# Patient Record
Sex: Female | Born: 1935 | Race: White | Hispanic: No | Smoking: Never smoker
Health system: Southern US, Community
[De-identification: ages and names within clinical notes are randomized; demographics above are authoritative.]

## PROBLEM LIST (undated history)

## (undated) DIAGNOSIS — I5033 Acute on chronic diastolic (congestive) heart failure: Secondary | ICD-10-CM

## (undated) DIAGNOSIS — I10 Essential (primary) hypertension: Secondary | ICD-10-CM

## (undated) DIAGNOSIS — I4891 Unspecified atrial fibrillation: Secondary | ICD-10-CM

## (undated) DIAGNOSIS — C50919 Malignant neoplasm of unspecified site of unspecified female breast: Secondary | ICD-10-CM

## (undated) DIAGNOSIS — E039 Hypothyroidism, unspecified: Secondary | ICD-10-CM

## (undated) DIAGNOSIS — I38 Endocarditis, valve unspecified: Secondary | ICD-10-CM

## (undated) HISTORY — DX: Unspecified atrial fibrillation: I48.91

## (undated) HISTORY — PX: MASTECTOMY: SHX3

## (undated) HISTORY — PX: OTHER SURGICAL HISTORY: SHX169

## (undated) HISTORY — DX: Acute on chronic diastolic (congestive) heart failure: I50.33

## (undated) HISTORY — DX: Endocarditis, valve unspecified: I38

## (undated) HISTORY — DX: Essential (primary) hypertension: I10

## (undated) HISTORY — DX: Hypothyroidism, unspecified: E03.9

## (undated) HISTORY — DX: Malignant neoplasm of unspecified site of unspecified female breast: C50.919

## (undated) HISTORY — PX: CHOLECYSTECTOMY: SHX55

---

## 1997-07-01 ENCOUNTER — Other Ambulatory Visit: Admission: RE | Admit: 1997-07-01 | Discharge: 1997-07-01 | Payer: Self-pay | Admitting: Cardiology

## 1997-09-08 ENCOUNTER — Other Ambulatory Visit: Admission: RE | Admit: 1997-09-08 | Discharge: 1997-09-08 | Payer: Self-pay | Admitting: Cardiology

## 1998-03-03 ENCOUNTER — Other Ambulatory Visit: Admission: RE | Admit: 1998-03-03 | Discharge: 1998-03-03 | Payer: Self-pay | Admitting: Cardiology

## 1999-05-26 ENCOUNTER — Emergency Department (HOSPITAL_COMMUNITY): Admission: EM | Admit: 1999-05-26 | Discharge: 1999-05-26 | Payer: Self-pay | Admitting: Emergency Medicine

## 1999-05-26 ENCOUNTER — Encounter: Payer: Self-pay | Admitting: Emergency Medicine

## 2000-03-19 ENCOUNTER — Ambulatory Visit (HOSPITAL_BASED_OUTPATIENT_CLINIC_OR_DEPARTMENT_OTHER): Admission: RE | Admit: 2000-03-19 | Discharge: 2000-03-19 | Payer: Self-pay | Admitting: Plastic Surgery

## 2000-03-19 ENCOUNTER — Encounter (INDEPENDENT_AMBULATORY_CARE_PROVIDER_SITE_OTHER): Payer: Self-pay | Admitting: Specialist

## 2000-07-03 ENCOUNTER — Other Ambulatory Visit: Admission: RE | Admit: 2000-07-03 | Discharge: 2000-07-03 | Payer: Self-pay | Admitting: Cardiology

## 2005-10-15 ENCOUNTER — Emergency Department (HOSPITAL_COMMUNITY): Admission: EM | Admit: 2005-10-15 | Discharge: 2005-10-15 | Payer: Self-pay | Admitting: Emergency Medicine

## 2008-03-27 ENCOUNTER — Inpatient Hospital Stay (HOSPITAL_COMMUNITY): Admission: AD | Admit: 2008-03-27 | Discharge: 2008-04-03 | Payer: Self-pay | Admitting: Cardiology

## 2008-04-02 ENCOUNTER — Encounter: Payer: Self-pay | Admitting: Cardiology

## 2008-08-07 ENCOUNTER — Ambulatory Visit (HOSPITAL_COMMUNITY): Admission: RE | Admit: 2008-08-07 | Discharge: 2008-08-07 | Payer: Self-pay | Admitting: Cardiology

## 2008-10-05 ENCOUNTER — Emergency Department (HOSPITAL_COMMUNITY): Admission: EM | Admit: 2008-10-05 | Discharge: 2008-10-05 | Payer: Self-pay | Admitting: Emergency Medicine

## 2008-10-18 ENCOUNTER — Ambulatory Visit: Payer: Self-pay | Admitting: Internal Medicine

## 2008-10-19 ENCOUNTER — Inpatient Hospital Stay (HOSPITAL_COMMUNITY): Admission: EM | Admit: 2008-10-19 | Discharge: 2008-10-21 | Payer: Self-pay | Admitting: Emergency Medicine

## 2008-10-26 ENCOUNTER — Ambulatory Visit: Payer: Self-pay | Admitting: Internal Medicine

## 2008-10-27 ENCOUNTER — Inpatient Hospital Stay (HOSPITAL_COMMUNITY): Admission: EM | Admit: 2008-10-27 | Discharge: 2008-10-30 | Payer: Self-pay | Admitting: Emergency Medicine

## 2009-07-18 ENCOUNTER — Emergency Department (HOSPITAL_COMMUNITY): Admission: EM | Admit: 2009-07-18 | Discharge: 2009-07-18 | Payer: Self-pay | Admitting: Podiatry

## 2009-10-15 ENCOUNTER — Ambulatory Visit: Payer: Self-pay | Admitting: Cardiology

## 2009-10-29 ENCOUNTER — Ambulatory Visit: Payer: Self-pay | Admitting: Cardiology

## 2009-11-08 ENCOUNTER — Ambulatory Visit: Payer: Self-pay | Admitting: Cardiology

## 2009-11-18 ENCOUNTER — Ambulatory Visit: Payer: Self-pay | Admitting: Cardiology

## 2009-12-09 ENCOUNTER — Ambulatory Visit: Payer: Self-pay | Admitting: Cardiology

## 2010-01-07 ENCOUNTER — Ambulatory Visit: Payer: Self-pay | Admitting: Cardiovascular Disease

## 2010-02-04 ENCOUNTER — Ambulatory Visit: Payer: Self-pay | Admitting: Cardiovascular Disease

## 2010-02-07 ENCOUNTER — Ambulatory Visit: Payer: Self-pay | Admitting: Cardiology

## 2010-02-10 ENCOUNTER — Ambulatory Visit: Payer: Self-pay | Admitting: Cardiology

## 2010-02-14 ENCOUNTER — Inpatient Hospital Stay (HOSPITAL_COMMUNITY)
Admission: EM | Admit: 2010-02-14 | Discharge: 2010-02-17 | Payer: Self-pay | Source: Home / Self Care | Attending: Cardiology | Admitting: Cardiology

## 2010-03-30 ENCOUNTER — Ambulatory Visit: Payer: Self-pay | Admitting: Cardiology

## 2010-04-06 ENCOUNTER — Ambulatory Visit: Payer: Self-pay | Admitting: Cardiology

## 2010-04-12 ENCOUNTER — Ambulatory Visit: Payer: Self-pay | Admitting: Cardiology

## 2010-04-16 ENCOUNTER — Encounter: Payer: Self-pay | Admitting: Cardiology

## 2010-04-16 DIAGNOSIS — I10 Essential (primary) hypertension: Secondary | ICD-10-CM | POA: Insufficient documentation

## 2010-04-16 DIAGNOSIS — I4891 Unspecified atrial fibrillation: Secondary | ICD-10-CM | POA: Insufficient documentation

## 2010-04-16 DIAGNOSIS — E039 Hypothyroidism, unspecified: Secondary | ICD-10-CM

## 2010-04-27 ENCOUNTER — Encounter (INDEPENDENT_AMBULATORY_CARE_PROVIDER_SITE_OTHER): Payer: Medicare Other

## 2010-04-27 DIAGNOSIS — Z7901 Long term (current) use of anticoagulants: Secondary | ICD-10-CM

## 2010-04-27 DIAGNOSIS — I4891 Unspecified atrial fibrillation: Secondary | ICD-10-CM

## 2010-05-11 ENCOUNTER — Encounter (INDEPENDENT_AMBULATORY_CARE_PROVIDER_SITE_OTHER): Payer: Medicare Other

## 2010-05-11 DIAGNOSIS — I4891 Unspecified atrial fibrillation: Secondary | ICD-10-CM

## 2010-05-11 DIAGNOSIS — Z7901 Long term (current) use of anticoagulants: Secondary | ICD-10-CM

## 2010-05-23 LAB — CBC
MCH: 31.8 pg (ref 26.0–34.0)
MCHC: 32.8 g/dL (ref 30.0–36.0)
MCHC: 33.4 g/dL (ref 30.0–36.0)
Platelets: 270 10*3/uL (ref 150–400)
Platelets: 284 10*3/uL (ref 150–400)
RDW: 14.8 % (ref 11.5–15.5)
RDW: 15 % (ref 11.5–15.5)

## 2010-05-23 LAB — CARDIAC PANEL(CRET KIN+CKTOT+MB+TROPI)
CK, MB: 5.1 ng/mL — ABNORMAL HIGH (ref 0.3–4.0)
Relative Index: 3.4 — ABNORMAL HIGH (ref 0.0–2.5)
Total CK: 152 U/L (ref 7–177)
Troponin I: 0.03 ng/mL (ref 0.00–0.06)

## 2010-05-23 LAB — DIFFERENTIAL
Basophils Absolute: 0 10*3/uL (ref 0.0–0.1)
Basophils Relative: 0 % (ref 0–1)
Neutro Abs: 8.1 10*3/uL — ABNORMAL HIGH (ref 1.7–7.7)
Neutrophils Relative %: 83 % — ABNORMAL HIGH (ref 43–77)

## 2010-05-23 LAB — POCT I-STAT, CHEM 8
BUN: 26 mg/dL — ABNORMAL HIGH (ref 6–23)
Calcium, Ion: 1.14 mmol/L (ref 1.12–1.32)
HCT: 43 % (ref 36.0–46.0)
TCO2: 26 mmol/L (ref 0–100)

## 2010-05-23 LAB — BASIC METABOLIC PANEL
BUN: 20 mg/dL (ref 6–23)
BUN: 23 mg/dL (ref 6–23)
CO2: 26 mEq/L (ref 19–32)
CO2: 27 mEq/L (ref 19–32)
Calcium: 8.3 mg/dL — ABNORMAL LOW (ref 8.4–10.5)
Calcium: 8.3 mg/dL — ABNORMAL LOW (ref 8.4–10.5)
Chloride: 109 mEq/L (ref 96–112)
Chloride: 110 mEq/L (ref 96–112)
Creatinine, Ser: 0.97 mg/dL (ref 0.4–1.2)
Creatinine, Ser: 1 mg/dL (ref 0.4–1.2)
GFR calc Af Amer: 52 mL/min — ABNORMAL LOW (ref >60)
GFR calc non Af Amer: 43 mL/min — ABNORMAL LOW (ref >60)
GFR calc non Af Amer: 46 mL/min — ABNORMAL LOW (ref >60)
Glucose, Bld: 115 mg/dL — ABNORMAL HIGH (ref 70–99)
Glucose, Bld: 127 mg/dL — ABNORMAL HIGH (ref 70–99)
Glucose, Bld: 141 mg/dL — ABNORMAL HIGH (ref 70–99)
Glucose, Bld: 178 mg/dL — ABNORMAL HIGH (ref 70–99)
Potassium: 3.2 mEq/L — ABNORMAL LOW (ref 3.5–5.1)
Sodium: 141 mEq/L (ref 135–145)

## 2010-05-23 LAB — CULTURE, BLOOD (ROUTINE X 2)
Culture  Setup Time: 201112060019
Culture: NO GROWTH
Culture: NO GROWTH

## 2010-05-23 LAB — URINALYSIS, ROUTINE W REFLEX MICROSCOPIC
Ketones, ur: NEGATIVE mg/dL
Nitrite: NEGATIVE
Protein, ur: NEGATIVE mg/dL
pH: 6 (ref 5.0–8.0)

## 2010-05-23 LAB — PROTIME-INR
INR: 1.74 — ABNORMAL HIGH (ref 0.00–1.49)
INR: 1.91 — ABNORMAL HIGH (ref 0.00–1.49)
INR: 2.33 — ABNORMAL HIGH (ref 0.00–1.49)
Prothrombin Time: 20.5 seconds — ABNORMAL HIGH (ref 11.6–15.2)
Prothrombin Time: 22 seconds — ABNORMAL HIGH (ref 11.6–15.2)

## 2010-05-23 LAB — BRAIN NATRIURETIC PEPTIDE
Pro B Natriuretic peptide (BNP): 512 pg/mL — ABNORMAL HIGH (ref 0.0–100.0)
Pro B Natriuretic peptide (BNP): 532 pg/mL — ABNORMAL HIGH (ref 0.0–100.0)

## 2010-05-23 LAB — CK TOTAL AND CKMB (NOT AT ARMC): Relative Index: 3.8 — ABNORMAL HIGH (ref 0.0–2.5)

## 2010-05-23 LAB — T4, FREE: Free T4: 1.33 ng/dL (ref 0.80–1.80)

## 2010-05-23 LAB — D-DIMER, QUANTITATIVE: D-Dimer, Quant: 1.54 ug/mL-FEU — ABNORMAL HIGH (ref 0.00–0.48)

## 2010-05-23 LAB — POCT CARDIAC MARKERS

## 2010-05-25 ENCOUNTER — Encounter (INDEPENDENT_AMBULATORY_CARE_PROVIDER_SITE_OTHER): Payer: Medicare Other

## 2010-05-25 DIAGNOSIS — Z7901 Long term (current) use of anticoagulants: Secondary | ICD-10-CM

## 2010-05-25 DIAGNOSIS — I4891 Unspecified atrial fibrillation: Secondary | ICD-10-CM

## 2010-06-06 ENCOUNTER — Encounter: Payer: Self-pay | Admitting: Cardiology

## 2010-06-06 ENCOUNTER — Ambulatory Visit (INDEPENDENT_AMBULATORY_CARE_PROVIDER_SITE_OTHER): Payer: Medicare Other | Admitting: Cardiology

## 2010-06-06 VITALS — BP 120/60 | HR 76 | Wt 107.0 lb

## 2010-06-06 DIAGNOSIS — I4891 Unspecified atrial fibrillation: Secondary | ICD-10-CM

## 2010-06-06 DIAGNOSIS — Z7901 Long term (current) use of anticoagulants: Secondary | ICD-10-CM

## 2010-06-06 DIAGNOSIS — I10 Essential (primary) hypertension: Secondary | ICD-10-CM

## 2010-06-06 NOTE — Assessment & Plan Note (Signed)
Blood pressure is remaining stable and her present medication.  No change in regimen.

## 2010-06-06 NOTE — Assessment & Plan Note (Signed)
The patient feels well.  She remains in atrial fibrillation with a controlled ventricular response.  Her INR is therapeutic at 3.0.  She did not eat as much last week because she was sick with bronchitis and a bad cold.  This would account for the fact that her INR is at the high end of the range today.  Continue same medication.

## 2010-06-06 NOTE — Progress Notes (Signed)
History of Present Illness: This pleasant 77 year old Caucasian female is seen back for a scheduled followup visit.  She has a history of established atrial fibrillation with a controlled ventricular response.  She is on long-term Coumadin.  She's had no new cardiac symptoms.  She did have a recent bad bout of bronchitis.  She has lost 3 pounds which she attributes to her recent illness.  Current Outpatient Prescriptions  Medication Sig Dispense Refill  . aspirin 81 MG tablet Take 81 mg by mouth daily.        . chlordiazePOXIDE (LIBRIUM) 5 MG capsule Take 5 mg by mouth daily as needed. For nerves       . digoxin (LANOXIN) 0.125 MG tablet Take 125 mcg by mouth daily.        Marland Kitchen diltiazem (CARDIZEM CD) 300 MG 24 hr capsule Take 300 mg by mouth daily.        . furosemide (LASIX) 40 MG tablet Take 40 mg by mouth daily.        . isosorbide mononitrate (IMDUR) 60 MG 24 hr tablet Take 60 mg by mouth daily.        Marland Kitchen levothyroxine (SYNTHROID, LEVOTHROID) 100 MCG tablet Take 100 mcg by mouth daily.        Marland Kitchen METOPROLOL TARTRATE PO Take by mouth. 100 mg  One half bid      . nitroGLYCERIN (NITROSTAT) 0.4 MG SL tablet Place 0.4 mg under the tongue every 5 (five) minutes as needed. For chest pains       . potassium chloride SA (K-DUR,KLOR-CON) 20 MEQ tablet Take 20 mEq by mouth every other day.        . warfarin (COUMADIN) 5 MG tablet Take 5 mg by mouth as directed. Taking 5mg  one day and 2.5 6 days or as directed        Allergies  Allergen Reactions  . Penicillins   . Pepcid   . Sulfa Antibiotics   . Tetracyclines & Related     Patient Active Problem List  Diagnoses  . Atrial fibrillation with controlled ventricular response  . Hypothyroidism  . Hypertension    History  Smoking status  . Never Smoker   Smokeless tobacco  . Not on file    History  Alcohol Use     No family history on file.  Review of Systems: Constitutional: no fever chills diaphoresis or fatigue or change in weight.    Head and neck: no hearing loss, no epistaxis, no photophobia or visual disturbance. Respiratory: No cough, shortness of breath or wheezing. Cardiovascular: No chest pain peripheral edema, palpitations. Gastrointestinal: No abdominal distention, no abdominal pain, no change in bowel habits hematochezia or melena. Genitourinary: No dysuria, no frequency, no urgency, no nocturia. Musculoskeletal:No arthralgias, no back pain, no gait disturbance or myalgias. Neurological: No dizziness, no headaches, no numbness, no seizures, no syncope, no weakness, no tremors. Hematologic: No lymphadenopathy, no easy bruising. Psychiatric: No confusion, no hallucinations, no sleep disturbance.    Physical Exam: Filed Vitals:   06/06/10 1404  BP: 120/60  Pulse: 76  Weight: 107 lb (48.535 kg)  This is a pleasant elderly woman in no distress.Pupils equal and reactive.   Extraocular Movements are full.  There is no scleral icterus.  The mouth and pharynx are normal.  The neck is supple.  The carotids reveal no bruits.  The jugular venous pressure is normal.  The thyroid is not enlarged.  There is no lymphadenopathy.The chest is clear to percussion and  auscultation. There are no rales or rhonchi. Expansion of the chest is symmetrical.  She's had a previous right mastectomy.The precordium is quiet.  The first heart sound is normal.  The second heart sound is physiologically split.  There is no murmur gallop rub or click.  There is no abnormal lift or heave.  The rhythm is irregular.The abdomen is soft and nontender. Bowel sounds are normal. The liver and spleen are not enlarged. There Are no abdominal masses. There are no bruits.The pedal pulses are good.  There is no phlebitis or edema.  There is no cyanosis or clubbing.  She does have chronic lymphedema of the right arm secondary to mastectomy.Strength is normal and symmetrical in all extremities.  There is no lateralizing weakness.  There are no sensory deficits.The  skin is warm and dry.  There is no rash.   Assessment / Plan:

## 2010-06-14 ENCOUNTER — Other Ambulatory Visit: Payer: Self-pay | Admitting: *Deleted

## 2010-06-14 DIAGNOSIS — E039 Hypothyroidism, unspecified: Secondary | ICD-10-CM

## 2010-06-14 MED ORDER — LEVOTHYROXINE SODIUM 100 MCG PO TABS: 100.0000 ug | ORAL_TABLET | Freq: Every day | ORAL | Status: DC

## 2010-06-14 NOTE — Telephone Encounter (Signed)
Refilled meds per fax request.  

## 2010-06-18 LAB — BASIC METABOLIC PANEL
BUN: 25 mg/dL — ABNORMAL HIGH (ref 6–23)
BUN: 20 mg/dL (ref 6–23)
BUN: 26 mg/dL — ABNORMAL HIGH (ref 6–23)
CO2: 23 mEq/L (ref 19–32)
CO2: 27 mEq/L (ref 19–32)
Calcium: 9.1 mg/dL (ref 8.4–10.5)
Calcium: 8.4 mg/dL (ref 8.4–10.5)
Chloride: 102 mEq/L (ref 96–112)
Chloride: 105 mEq/L (ref 96–112)
Chloride: 106 mEq/L (ref 96–112)
Chloride: 107 mEq/L (ref 96–112)
Creatinine, Ser: 1.1 mg/dL (ref 0.4–1.2)
Creatinine, Ser: 1.45 mg/dL — ABNORMAL HIGH (ref 0.4–1.2)
Creatinine, Ser: 1.85 mg/dL — ABNORMAL HIGH (ref 0.4–1.2)
GFR calc Af Amer: 56 mL/min — ABNORMAL LOW (ref >60)
GFR calc Af Amer: 31 mL/min — ABNORMAL LOW (ref >60)
GFR calc non Af Amer: 47 mL/min — ABNORMAL LOW (ref >60)
GFR calc non Af Amer: 26 mL/min — ABNORMAL LOW (ref >60)
GFR calc non Af Amer: 34 mL/min — ABNORMAL LOW (ref >60)
GFR calc non Af Amer: 55 mL/min — ABNORMAL LOW (ref >60)
Glucose, Bld: 160 mg/dL — ABNORMAL HIGH (ref 70–99)
Glucose, Bld: 98 mg/dL (ref 70–99)
Potassium: 4.4 mEq/L (ref 3.5–5.1)
Potassium: 4.8 mEq/L (ref 3.5–5.1)
Sodium: 133 mEq/L — ABNORMAL LOW (ref 135–145)
Sodium: 138 mEq/L (ref 135–145)

## 2010-06-18 LAB — CARDIAC PANEL(CRET KIN+CKTOT+MB+TROPI)
CK, MB: 1.6 ng/mL (ref 0.3–4.0)
CK, MB: 2.2 ng/mL (ref 0.3–4.0)
Total CK: 44 U/L (ref 7–177)
Troponin I: 0.03 ng/mL (ref 0.00–0.06)

## 2010-06-18 LAB — COMPREHENSIVE METABOLIC PANEL
ALT: 45 U/L — ABNORMAL HIGH (ref 0–35)
ALT: 59 U/L — ABNORMAL HIGH (ref 0–35)
AST: 27 U/L (ref 0–37)
AST: 41 U/L — ABNORMAL HIGH (ref 0–37)
Alkaline Phosphatase: 104 U/L (ref 39–117)
BUN: 15 mg/dL (ref 6–23)
CO2: 26 mEq/L (ref 19–32)
CO2: 28 mEq/L (ref 19–32)
CO2: 29 mEq/L (ref 19–32)
Calcium: 8.2 mg/dL — ABNORMAL LOW (ref 8.4–10.5)
Calcium: 8.5 mg/dL (ref 8.4–10.5)
Chloride: 101 mEq/L (ref 96–112)
Chloride: 106 mEq/L (ref 96–112)
Creatinine, Ser: 0.84 mg/dL (ref 0.4–1.2)
Creatinine, Ser: 0.85 mg/dL (ref 0.4–1.2)
GFR calc Af Amer: >60 mL/min (ref >60)
GFR calc non Af Amer: 50 mL/min — ABNORMAL LOW (ref >60)
GFR calc non Af Amer: >60 mL/min (ref >60)
GFR calc non Af Amer: >60 mL/min (ref >60)
Glucose, Bld: 116 mg/dL — ABNORMAL HIGH (ref 70–99)
Glucose, Bld: 90 mg/dL (ref 70–99)
Potassium: 5 mEq/L (ref 3.5–5.1)
Sodium: 138 mEq/L (ref 135–145)
Total Bilirubin: 0.9 mg/dL (ref 0.3–1.2)
Total Bilirubin: 0.9 mg/dL (ref 0.3–1.2)

## 2010-06-18 LAB — POCT CARDIAC MARKERS
CKMB, poc: 1.3 ng/mL (ref 1.0–8.0)
Myoglobin, poc: 102 ng/mL (ref 12–200)
Myoglobin, poc: 84.7 ng/mL (ref 12–200)
Troponin i, poc: <0.05 ng/mL (ref 0.00–0.09)
Troponin i, poc: <0.05 ng/mL (ref 0.00–0.09)

## 2010-06-18 LAB — LIPID PANEL
Cholesterol: 124 mg/dL (ref 0–200)
LDL Cholesterol: 59 mg/dL (ref 0–99)
Total CHOL/HDL Ratio: 2.4 RATIO

## 2010-06-18 LAB — TROPONIN I: Troponin I: 0.04 ng/mL (ref 0.00–0.06)

## 2010-06-18 LAB — CK TOTAL AND CKMB (NOT AT ARMC): Relative Index: INVALID (ref 0.0–2.5)

## 2010-06-18 LAB — PROTIME-INR
INR: 2 — ABNORMAL HIGH (ref 0.00–1.49)
INR: 2.8 — ABNORMAL HIGH (ref 0.00–1.49)
INR: 2.5 — ABNORMAL HIGH (ref 0.00–1.49)
Prothrombin Time: 29.1 seconds — ABNORMAL HIGH (ref 11.6–15.2)
Prothrombin Time: 29.4 seconds — ABNORMAL HIGH (ref 11.6–15.2)
Prothrombin Time: 36.8 seconds — ABNORMAL HIGH (ref 11.6–15.2)

## 2010-06-18 LAB — CBC
HCT: 41.6 % (ref 36.0–46.0)
HCT: 40.6 % (ref 36.0–46.0)
HCT: 41.7 % (ref 36.0–46.0)
Hemoglobin: 13.6 g/dL (ref 12.0–15.0)
Hemoglobin: 14.1 g/dL (ref 12.0–15.0)
Hemoglobin: 13.8 g/dL (ref 12.0–15.0)
Hemoglobin: 14 g/dL (ref 12.0–15.0)
MCHC: 33.7 g/dL (ref 30.0–36.0)
MCHC: 34 g/dL (ref 30.0–36.0)
MCHC: 34.6 g/dL (ref 30.0–36.0)
MCV: 94.3 fL (ref 78.0–100.0)
MCV: 94.6 fL (ref 78.0–100.0)
MCV: 95 fL (ref 78.0–100.0)
MCV: 95.6 fL (ref 78.0–100.0)
Platelets: 184 10*3/uL (ref 150–400)
Platelets: 222 10*3/uL (ref 150–400)
Platelets: 227 10*3/uL (ref 150–400)
Platelets: 235 10*3/uL (ref 150–400)
RBC: 4.17 MIL/uL (ref 3.87–5.11)
RBC: 4.35 MIL/uL (ref 3.87–5.11)
RBC: 4.09 MIL/uL (ref 3.87–5.11)
RDW: 15.8 % — ABNORMAL HIGH (ref 11.5–15.5)
RDW: 15.6 % — ABNORMAL HIGH (ref 11.5–15.5)
RDW: 15.6 % — ABNORMAL HIGH (ref 11.5–15.5)
WBC: 9.6 10*3/uL (ref 4.0–10.5)
WBC: 5.8 10*3/uL (ref 4.0–10.5)
WBC: 6.2 10*3/uL (ref 4.0–10.5)

## 2010-06-18 LAB — DIFFERENTIAL
Eosinophils Absolute: 0 10*3/uL (ref 0.0–0.7)
Eosinophils Relative: 0 % (ref 0–5)
Lymphocytes Relative: 4 % — ABNORMAL LOW (ref 12–46)
Lymphocytes Relative: 12 % (ref 12–46)
Lymphs Abs: 0.8 10*3/uL (ref 0.7–4.0)
Monocytes Absolute: 1.4 10*3/uL — ABNORMAL HIGH (ref 0.1–1.0)
Monocytes Relative: 9 % (ref 3–12)
Monocytes Relative: 8 % (ref 3–12)
Neutro Abs: 12.5 10*3/uL — ABNORMAL HIGH (ref 1.7–7.7)
Neutro Abs: 4.9 10*3/uL (ref 1.7–7.7)
Neutrophils Relative %: 78 % — ABNORMAL HIGH (ref 43–77)

## 2010-06-18 LAB — GLUCOSE, CAPILLARY: Glucose-Capillary: 119 mg/dL — ABNORMAL HIGH (ref 70–99)

## 2010-06-18 LAB — URINALYSIS, ROUTINE W REFLEX MICROSCOPIC
Glucose, UA: NEGATIVE mg/dL
Ketones, ur: NEGATIVE mg/dL
Protein, ur: NEGATIVE mg/dL
pH: 5.5 (ref 5.0–8.0)

## 2010-06-18 LAB — APTT: aPTT: 31 seconds (ref 24–37)

## 2010-06-18 LAB — URINE MICROSCOPIC-ADD ON

## 2010-06-18 LAB — BRAIN NATRIURETIC PEPTIDE
Pro B Natriuretic peptide (BNP): 570 pg/mL — ABNORMAL HIGH (ref 0.0–100.0)
Pro B Natriuretic peptide (BNP): 661 pg/mL — ABNORMAL HIGH (ref 0.0–100.0)
Pro B Natriuretic peptide (BNP): 586 pg/mL — ABNORMAL HIGH (ref 0.0–100.0)

## 2010-06-18 LAB — T4, FREE: Free T4: 1.4 ng/dL (ref 0.80–1.80)

## 2010-06-18 LAB — MAGNESIUM: Magnesium: 2.1 mg/dL (ref 1.5–2.5)

## 2010-06-19 LAB — BASIC METABOLIC PANEL
BUN: 12 mg/dL (ref 6–23)
CO2: 26 mEq/L (ref 19–32)
Chloride: 106 mEq/L (ref 96–112)
Creatinine, Ser: 0.69 mg/dL (ref 0.4–1.2)
Potassium: 3.7 mEq/L (ref 3.5–5.1)

## 2010-06-19 LAB — POCT CARDIAC MARKERS
CKMB, poc: 1.1 ng/mL (ref 1.0–8.0)
CKMB, poc: 1.4 ng/mL (ref 1.0–8.0)
Myoglobin, poc: 80.7 ng/mL (ref 12–200)
Myoglobin, poc: 85.5 ng/mL (ref 12–200)
Troponin i, poc: <0.05 ng/mL (ref 0.00–0.09)
Troponin i, poc: <0.05 ng/mL (ref 0.00–0.09)

## 2010-06-19 LAB — D-DIMER, QUANTITATIVE: D-Dimer, Quant: 1.41 ug/mL-FEU — ABNORMAL HIGH (ref 0.00–0.48)

## 2010-06-19 LAB — PROTIME-INR
INR: 1.4 (ref 0.00–1.49)
Prothrombin Time: 18.2 seconds — ABNORMAL HIGH (ref 11.6–15.2)

## 2010-06-19 LAB — APTT: aPTT: 32 seconds (ref 24–37)

## 2010-06-21 LAB — GLUCOSE, CAPILLARY: Glucose-Capillary: 107 mg/dL — ABNORMAL HIGH (ref 70–99)

## 2010-06-27 LAB — GIARDIA/CRYPTOSPORIDIUM SCREEN(EIA)
Cryptosporidium Screen (EIA): NEGATIVE
Giardia Screen - EIA: NEGATIVE

## 2010-06-27 LAB — CLOSTRIDIUM DIFFICILE EIA

## 2010-06-27 LAB — URINALYSIS, ROUTINE W REFLEX MICROSCOPIC
Bilirubin Urine: NEGATIVE
Ketones, ur: NEGATIVE mg/dL
Nitrite: POSITIVE — AB
pH: 5.5 (ref 5.0–8.0)

## 2010-06-27 LAB — COMPREHENSIVE METABOLIC PANEL
ALT: 69 U/L — ABNORMAL HIGH (ref 0–35)
ALT: 42 U/L — ABNORMAL HIGH (ref 0–35)
AST: 59 U/L — ABNORMAL HIGH (ref 0–37)
AST: 36 U/L (ref 0–37)
Albumin: 3.4 g/dL — ABNORMAL LOW (ref 3.5–5.2)
Albumin: 3 g/dL — ABNORMAL LOW (ref 3.5–5.2)
Alkaline Phosphatase: 90 U/L (ref 39–117)
Alkaline Phosphatase: 80 U/L (ref 39–117)
Calcium: 8.9 mg/dL (ref 8.4–10.5)
GFR calc Af Amer: >60 mL/min (ref >60)
GFR calc Af Amer: >60 mL/min (ref >60)
Potassium: 3.5 mEq/L (ref 3.5–5.1)
Potassium: 3.6 mEq/L (ref 3.5–5.1)
Sodium: 139 mEq/L (ref 135–145)
Sodium: 141 mEq/L (ref 135–145)
Total Protein: 5.9 g/dL — ABNORMAL LOW (ref 6.0–8.3)
Total Protein: 5.5 g/dL — ABNORMAL LOW (ref 6.0–8.3)

## 2010-06-27 LAB — BASIC METABOLIC PANEL
BUN: 22 mg/dL (ref 6–23)
CO2: 24 mEq/L (ref 19–32)
CO2: 25 mEq/L (ref 19–32)
Calcium: 8.4 mg/dL (ref 8.4–10.5)
Calcium: 8.6 mg/dL (ref 8.4–10.5)
Chloride: 108 mEq/L (ref 96–112)
Creatinine, Ser: 0.83 mg/dL (ref 0.4–1.2)
Creatinine, Ser: 0.88 mg/dL (ref 0.4–1.2)
GFR calc Af Amer: >60 mL/min (ref >60)
GFR calc Af Amer: >60 mL/min (ref >60)
GFR calc non Af Amer: 59 mL/min — ABNORMAL LOW (ref >60)
GFR calc non Af Amer: >60 mL/min (ref >60)
Glucose, Bld: 103 mg/dL — ABNORMAL HIGH (ref 70–99)
Glucose, Bld: 107 mg/dL — ABNORMAL HIGH (ref 70–99)
Glucose, Bld: 110 mg/dL — ABNORMAL HIGH (ref 70–99)
Potassium: 3.8 mEq/L (ref 3.5–5.1)
Potassium: 4.9 mEq/L (ref 3.5–5.1)
Sodium: 138 mEq/L (ref 135–145)
Sodium: 140 mEq/L (ref 135–145)

## 2010-06-27 LAB — CARDIAC PANEL(CRET KIN+CKTOT+MB+TROPI)
CK, MB: 4.5 ng/mL — ABNORMAL HIGH (ref 0.3–4.0)
CK, MB: 3.4 ng/mL (ref 0.3–4.0)
CK, MB: 4 ng/mL (ref 0.3–4.0)
Relative Index: INVALID (ref 0.0–2.5)
Relative Index: INVALID (ref 0.0–2.5)
Relative Index: INVALID (ref 0.0–2.5)
Total CK: 82 U/L (ref 7–177)
Total CK: 99 U/L (ref 7–177)
Total CK: 160 U/L (ref 7–177)
Total CK: 268 U/L — ABNORMAL HIGH (ref 7–177)
Troponin I: 0.04 ng/mL (ref 0.00–0.06)
Troponin I: 0.02 ng/mL (ref 0.00–0.06)

## 2010-06-27 LAB — BRAIN NATRIURETIC PEPTIDE: Pro B Natriuretic peptide (BNP): 381 pg/mL — ABNORMAL HIGH (ref 0.0–100.0)

## 2010-06-27 LAB — CBC
HCT: 36.2 % (ref 36.0–46.0)
HCT: 38.1 % (ref 36.0–46.0)
Hemoglobin: 13.6 g/dL (ref 12.0–15.0)
Hemoglobin: 12.2 g/dL (ref 12.0–15.0)
Hemoglobin: 12.3 g/dL (ref 12.0–15.0)
Hemoglobin: 12.8 g/dL (ref 12.0–15.0)
Hemoglobin: 12.8 g/dL (ref 12.0–15.0)
MCHC: 33.5 g/dL (ref 30.0–36.0)
MCHC: 34 g/dL (ref 30.0–36.0)
MCHC: 34 g/dL (ref 30.0–36.0)
MCV: 94 fL (ref 78.0–100.0)
MCV: 94.3 fL (ref 78.0–100.0)
Platelets: 176 10*3/uL (ref 150–400)
Platelets: 225 10*3/uL (ref 150–400)
RBC: 3.83 MIL/uL — ABNORMAL LOW (ref 3.87–5.11)
RDW: 14.1 % (ref 11.5–15.5)
RDW: 13.9 % (ref 11.5–15.5)
RDW: 14.5 % (ref 11.5–15.5)
WBC: 5.2 10*3/uL (ref 4.0–10.5)

## 2010-06-27 LAB — URINE MICROSCOPIC-ADD ON

## 2010-06-27 LAB — STOOL CULTURE

## 2010-06-27 LAB — APTT: aPTT: 26 seconds (ref 24–37)

## 2010-06-29 ENCOUNTER — Encounter: Payer: Self-pay | Admitting: Cardiology

## 2010-06-29 ENCOUNTER — Ambulatory Visit (INDEPENDENT_AMBULATORY_CARE_PROVIDER_SITE_OTHER): Payer: Medicare Other | Admitting: Cardiology

## 2010-06-29 ENCOUNTER — Ambulatory Visit (INDEPENDENT_AMBULATORY_CARE_PROVIDER_SITE_OTHER): Payer: Medicare Other | Admitting: *Deleted

## 2010-06-29 VITALS — BP 130/70 | HR 72 | Wt 109.0 lb

## 2010-06-29 DIAGNOSIS — I10 Essential (primary) hypertension: Secondary | ICD-10-CM

## 2010-06-29 DIAGNOSIS — I4891 Unspecified atrial fibrillation: Secondary | ICD-10-CM

## 2010-06-29 DIAGNOSIS — E875 Hyperkalemia: Secondary | ICD-10-CM

## 2010-06-29 LAB — POCT INR: INR: 2.3

## 2010-06-29 LAB — BASIC METABOLIC PANEL
CO2: 25 mEq/L (ref 19–32)
Chloride: 104 mEq/L (ref 96–112)
Creatinine, Ser: 0.8 mg/dL (ref 0.4–1.2)
Potassium: 4.7 mEq/L (ref 3.5–5.1)
Sodium: 143 mEq/L (ref 135–145)

## 2010-06-29 NOTE — Assessment & Plan Note (Signed)
The patient has not been expressing any headaches or dizziness.  She does have some increased fatigue which he attributes to her age.

## 2010-06-29 NOTE — Progress Notes (Signed)
[name] Date of Birth:  [birth year] [facility] / [facility] [address]   [address] [place], [place]  [address] [phone]           Fax   [phone]  HPI: This pleasant 77 year old woman is seen for a scheduled followup office visit.  She's had a long history of atrial fibrillation.  She has tachybradycardia syndrome and does not do well when she is in normal sinus rhythm because of extreme bradycardia she's not having any thromboembolic symptoms from her atrial fibrillation and she's been doing well with her Coumadin.  She does not have any history of ischemic heart disease.  She had a normal adenosine Cardiolite stress test on 12/27/06 which showed an ejection fraction of 73% and no ischemia.  The patient had an echocardiogram on 01/29/08 showed an ejection fraction of 60-65% biatrial enlargement, mild aortic stenosis with mild aortic insufficiency, moderate tricuspid regurgitation with moderate pulmonary hypertension and moderate mitral regurgitation.  She has not been expressing any symptoms of CHF.  She is on daily Lasix 40 mg daily  Current Outpatient Prescriptions  Medication Sig Dispense Refill  . aspirin 81 MG tablet Take 81 mg by mouth daily.        . chlordiazePOXIDE (LIBRIUM) 5 MG capsule Take 5 mg by mouth daily as needed. For nerves       . digoxin (LANOXIN) 0.125 MG tablet Take 125 mcg by mouth daily.        Marland Kitchen diltiazem (CARDIZEM CD) 300 MG 24 hr capsule Take 300 mg by mouth daily.        . furosemide (LASIX) 40 MG tablet Take 40 mg by mouth daily.        . isosorbide mononitrate (IMDUR) 60 MG 24 hr tablet Take 60 mg by mouth daily.        Marland Kitchen levothyroxine (SYNTHROID, LEVOTHROID) 100 MCG tablet Take 1 tablet (100 mcg total) by mouth daily.  31 tablet  11  . METOPROLOL TARTRATE PO Take by mouth. 100 mg  One half bid      . nitroGLYCERIN (NITROSTAT) 0.4 MG SL tablet Place 0.4 mg under the tongue every 5 (five) minutes as needed. For chest pains       .  potassium chloride SA (K-DUR,KLOR-CON) 20 MEQ tablet Take 20 mEq by mouth every other day.        . warfarin (COUMADIN) 5 MG tablet Take 5 mg by mouth as directed. Taking 5mg  one day and 2.5 6 days or as directed        Allergies  Allergen Reactions  . Penicillins   . Pepcid   . Sulfa Antibiotics   . Tetracyclines & Related     Patient Active Problem List  Diagnoses  . Atrial fibrillation with controlled ventricular response  . Hypothyroidism  . Hypertension    History  Smoking status  . Never Smoker   Smokeless tobacco  . Not on file    History  Alcohol Use     No family history on file.  Review of Systems: The patient denies any heat or cold intolerance.  No weight gain or weight loss.  The patient denies headaches or blurry vision.  There is no cough or sputum production.  The patient denies dizziness.  There is no hematuria or hematochezia.  The patient denies any muscle aches or arthritis.  The patient denies any rash.  The patient denies frequent falling or instability.  There is no history of  depression or anxiety.  All other systems were reviewed and are negative.   Physical Exam: Filed Vitals:   06/29/10 1105  BP: 130/70  Pulse: 72  Weight is 109, up 2 pounds.  General appearance reveals a well-developed well-nourished woman in no distress.  She appears younger than her stated age of 32.Pupils equal and reactive.   Extraocular Movements are full.  There is no scleral icterus.  The mouth and pharynx are normal.  The neck is supple.  The carotids reveal no bruits.  The jugular venous pressure is normal.  The thyroid is not enlarged.  There is no lymphadenopathy.The chest is clear to percussion and auscultation. There are no rales or rhonchi. Expansion of the chest is symmetrical.  The right breast is absent.The precordium is quiet.  The first heart sound is normal.  The second heart sound is physiologically split.  There is no  gallop rub or click.There is a grade 2/6  systolic ejection murmur at the base.  There is no abnormal lift or heave. The rhythm is irregular.The abdomen is soft and nontender. Bowel sounds are normal. The liver and spleen are not enlarged. There Are no abdominal masses. There are no bruits.The pedal pulses are good.  There is no phlebitis or edema.  There is no cyanosis or clubbing.She does have lymphedema of the right arm related to previous right mastectomy.    Assessment / Plan: Recheck in one month for protime and in 2 months for a protime and in 3 months for office visit and protime.  We are rechecking a basal metabolic panel today since she has had problems with potassium supplementation in the past.  At the present time she takes potassium just every other day.

## 2010-06-29 NOTE — Assessment & Plan Note (Signed)
The patient has a past history of established atrial fibrillation.  She does better in atrial fibrillation and she did in normal sinus rhythm when she had marked sinus bradycardia.  She has done well with her chronic Coumadin anticoagulation.  Her INR today is therapeutic at 2.3.  She has not had any thromboembolic episodes.  She's not having any dizzy spells or falling episodes or chest pain.

## 2010-07-05 ENCOUNTER — Telehealth: Payer: Self-pay | Admitting: *Deleted

## 2010-07-05 NOTE — Telephone Encounter (Signed)
Advised patient of lab results, continue same medications

## 2010-07-05 NOTE — Telephone Encounter (Signed)
Message copied by Regis Bill on Tue Jul 05, 2010  4:14 PM ------      Message from: Cassell Clement      Created: Wed Jun 29, 2010  9:49 PM       CSD.

## 2010-07-07 ENCOUNTER — Encounter: Payer: Medicare Other | Admitting: *Deleted

## 2010-07-26 NOTE — H&P (Signed)
[name], [name]                ACCOUNT NO.:  [id number]   MEDICAL RECORD NO.:  [id number]          PATIENT TYPE:  INP   LOCATION:  4734                         FACILITY:  [facility]   PHYSICIAN:  Cassell Clement, M.D. DATE OF BIRTH:  31-Jan-1915   DATE OF ADMISSION:  03/27/2008  DATE OF DISCHARGE:                              HISTORY & PHYSICAL   CHIEF COMPLAINT:  Dizziness.   HISTORY:  This is a 77 year old single Caucasian female seen in the  office today complaining of being dizzy for the past week.  She has  noted occasional feeling of palpitations in her chest.  She has not had  any chest pain or angina.  She does have a past history of mild  cardiomegaly.  She had a two-dimensional echocardiogram in our office on  January 29, 2008, showing an ejection fraction of 60-65%, biatrial  enlargement, and elevated right ventricular systolic pressure of 55, and  moderate tricuspid regurgitation.   PRESENT MEDICATIONS:  At home;  1. Synthroid 100 mcg daily.  2. Aspirin 325 daily.  3. Lisinopril 20 mg b.i.d.  4. Metoprolol 25 mg b.i.d.  5. Hydrochlorothiazide 25 mg daily.  6. Simvastatin 20 mg daily.  7. Nitrostat 1/150 sublingually p.r.n.  8. Centrum Silver 1 daily.   The patient does not have any history of known coronary artery disease.  On December 27, 2006, she had a walking adenosine protocol Cardiolite  stress test which showed an ejection fraction of 73% and no evidence of  ischemia and was a normal study.   As noted, the patient has not been feeling well for about a week with  feeling of palpitations.  In September, she had had problems with  palpitations and was found to have bradycardia with frequent PVCs and  her beta-blocker was transiently reduced to just once a day, but on her  most recent visit, we put her back to twice a day because she was  complaining of forceful palpitations.  Exam at that time showed no  ischemic changes on January 29, 2008, and she was in  normal sinus  rhythm at a rate of 99.   SOCIAL HISTORY:  She is a widow.  She is a retired Interior and spatial designer.  She  does not use alcohol or tobacco.   She is allergic to PENICILLIN, TETRACYCLINE, and SEPTRA.   FAMILY HISTORY:  Notable for stroke in her father, who died at 54.  Mother died of pneumonia at 70.  The patient has been a widow for many  years.  She avoids caffeine.   REVIEW OF SYSTEMS:  No cough or sputum production.  She has not had any  recent fever, chills, or constitutional symptoms.  She denies any recent  change in bowel habits, hematochezia, or melena.  She is not having any  genitourinary symptoms, dysuria, or hematuria.  All other systems  negative in detail.   PHYSICAL EXAMINATION:  VITAL SIGNS:  Her blood pressure is 140/60,  weight is 111 in the office, and her pulse is 160 in atrial fib.  GENERAL:  This is a well-developed, well-nourished Caucasian female  in  no acute distress.  SKIN:  Clear, warm, and dry.  HEENT:  Pupils are equal and reactive.  Extraocular movements are full.  Sclerae are clear.  Mouth and pharynx normal.  NECK:  Jugular venous pressure normal.  Carotids normal.  Thyroid  normal.  No lymphadenopathy.  CHEST:  Clear without rales.  The thorax reveals that she has had a  right mastectomy.  She has mild lymphedema of the right arm.  She has no  masses in the left breast.  HEART:  Rapid heart rate.  There is no murmur, gallop, rub, or click.  ABDOMEN:  Soft without hepatosplenomegaly or masses.  EXTREMITIES:  No phlebitis or edema.  NEUROLOGIC:  Physiologic.  The patient is alert and oriented.   Her EKG done in the office shows new atrial fib with a rapid ventricular  response of 160.   IMPRESSION:  1. New rapid atrial fibrillation probably 1 week duration.  2. Dizziness and weakness secondary to new rapid atrial fibrillation      x1 week.  3. Compensated hypothyroidism.  4. History of essential hypertension.  5. History of pulmonary  hypertension.  6. Hyperlipidemia.   DISPOSITION:  We are admitting to telemetry.  We will anticoagulate with  Lovenox and Coumadin.  We will use rate control with IV heparin and we  will continue her on her beta-blocker as well.  We will also continue  her ACE inhibitor.  We will check thyroid function studies, cardiac  enzymes, B-type natriuretic peptide, etc.           ______________________________  Cassell Clement, M.D.     TB/MEDQ  D:  03/27/2008  T:  03/28/2008  Job:  [id number]

## 2010-07-26 NOTE — Discharge Summary (Signed)
[name]                ACCOUNT NO.:  [id number]   MEDICAL RECORD NO.:  [id number]          PATIENT TYPE:  INP   LOCATION:  4734                         FACILITY:  [facility]   PHYSICIAN:  Cassell Clement, M.D. DATE OF BIRTH:  May 08, 1914   DATE OF ADMISSION:  03/27/2008  DATE OF DISCHARGE:  04/03/2008                               DISCHARGE SUMMARY   FINAL DIAGNOSES:  1. Paroxysmal atrial fibrillation, resolved.  2. Dizziness and weakness, secondary to recent onset of atrial      fibrillation 1 week prior to admission.  3. Compensated hypothyroidism.  4. Essential hypertension.  5. History of pulmonary hypertension.  6. Hyperlipidemia.   OPERATIONS PERFORMED:  None.   HISTORY:  This 77 year old woman came to the office complaining of  dizziness.  On March 27, 2008, she was noted to have a new rapid  atrial fibrillation rhythm.  She did not have any chest pain.  She does  have a past history of mild cardiomegaly, normal left ventricular  ejection fraction of 60-65% by echo on January 29, 2008, history of  biatrial enlargement and elevated right ventricular systolic pressure  and moderate tricuspid regurgitation.  She has a history of  hypothyroidism and essential hypertension, and hypercholesterolemia.   PHYSICAL EXAMINATION:  GENERAL:  On admission, showed a well-developed  and well-nourished woman, in no acute distress, but was complaining of  dizziness.  HEART:  Rapid rate with no murmur, gallop, rub, or click.  LUNGS:  Clear.  ABDOMEN:  Soft.  EXTREMITIES:  No phlebitis or edema.   HOSPITAL COURSE:  The patient was admitted to telemetry.  We started her  on anticoagulation with Lovenox and Coumadin.  She was begun on IV  Cardizem for rate control and she was continued on her ACE inhibitor.  By the next day, she was still in atrial fib, but heart rate was  controlled on Cardizem, and the Cardizem was switched to the oral root.  Lovenox was continued and Coumadin  was begun on admission.  By March 29, 2008, she was back in normal sinus rhythm.  On March 30, 2008, she  complained of dyspnea on exertion and it was noted that her B-  natriuretic peptide on admission had been elevated.  A chest x-ray was  obtained, which showed evidence of mild congestive heart failure with  bilateral pleural effusions.  She responded nicely to low-dose Lasix.  Chest x-ray also raised question of possible nodule at the right apex.  A CT scan was obtained, which could not rule out malignancy.  They  recommended a PET scan.  The patient had a PET scan on April 02, 2008.  This showed that the PET scan was suggestive of a benign low-grade  inflammatory process such as a post radiation change, although the  possibility of a low-grade bronchogenic carcinoma such as a  bronchoalveolar cell carcinoma or well-differentiated adenocarcinoma  could not be excluded.  It was felt that in view of her advanced age, we  would follow this expectantly with followup CT scan in 3-4 months.  By  April 03, 2008,  the patient was ready for discharge.  Her INR was 2.2.   ACCESSORY LABORATORY STUDIES:  Hemoglobin 12.8.  On discharge, white  count 5200.  Sodium 138, potassium 3.8, BUN 22, creatinine 0.88, blood  sugar 99.  B-natriuretic peptide one day prior to discharge was 256.  On  admission, the B-natriuretic peptide had been 571.  The patient did have  1 day of diarrhea and stool for Giardia, cryptosporidiosis, and C.  difficile were negative.   The patient is being discharged improved.  She will have home health RN  to check her and draw Protimes every Monday and Thursday at home.   DISCHARGE MEDICATIONS:  He is being discharged on the following regimen:  1. Synthroid 0.1 mg daily.  2. Simvastatin 20 mg each evening.  3. Coumadin 5 mg as directed.  4. Centrum Silver one daily.  5. Lisinopril 20 mg daily.  6. Metoprolol 25 mg a 0.5 tablet twice a day.  7. Furosemide 20 mg  daily.  8. Aspirin 81 mg daily.  9. Nitrostat p.r.n.  10.Librium 5 mg generic p.r.n. for nerve.  11.K tabs 10 mEq one daily.   We will plan to do a CT scan in about 3-4 months on chest.  For the next  3 days, she will take 5 mg warfarin today, 2.5 mg tomorrow and 5 mg  Sunday, and have her Coumadin checked on Monday by home health.   CONDITION ON DISCHARGE:  Improved.   Time spent going over discharge plans, instructions, medications, etc.,  greater than 30 minutes.           ______________________________  Cassell Clement, M.D.     TB/MEDQ  D:  04/03/2008  T:  04/03/2008  Job:  [id number]

## 2010-07-26 NOTE — H&P (Signed)
[name], [name]                ACCOUNT NO.:  [id number]   MEDICAL RECORD NO.:  [id number]          PATIENT TYPE:  INP   LOCATION:  1827                         FACILITY:  [facility]   PHYSICIAN:  Therisa Doyne, MD    DATE OF BIRTH:  11/25/1914   DATE OF ADMISSION:  10/26/2008  DATE OF DISCHARGE:                              HISTORY & PHYSICAL   CHIEF COMPLAINT:  Left-sided chest pain.   HISTORY OF PRESENT ILLNESS:  A 77 year old white female with a past  medical history significant for diastolic heart failure, hypertension,  paroxysmal atrial fibrillation on anticoagulation and amiodarone therapy  who presents for evaluation of chest pain.  The patient was recently  hospitalized last week for atrial fibrillation with rapid ventricular  response.  She was initiated on amiodarone therapy and systemically  anticoagulated with Coumadin.  She did well and was discharged last  week.  This afternoon, she developed left-sided chest pain that radiated  across the anterior portion of her chest and went to her left shoulder.  She endorses symptoms of fatigue but denied any shortness of breath or  nausea or diaphoresis.  She came to the emergency room for further  evaluation.   Of note, the patient has been under a significant amount of stress for  the past 3 weeks.  Her sister died 3 weeks ago when she was in her  presence.  She reports that her atrial fibrillation has been under good  control.  She says that her heart has been in rhythm for the past few  days.  She denies any tachy palpitations, syncope, PND, orthopnea, or  lower extremity edema.   REVIEW OF SYSTEMS:  All system reviewed and negative except as mentioned  above in the history of present illness.   PAST MEDICAL HISTORY:  1. Paroxysmal atrial fibrillation, on amiodarone therapy as well as      Coumadin.  She is currently in normal sinus rhythm.  2. Hypertension.  3. Hypothyroidism.  4. Diastolic heart failure.   SOCIAL  HISTORY:  The patient lives at home by herself.  She is still  functional with activities of daily and drives currently.   FAMILY HISTORY:  Father died of heart disease.   ALLERGIES:  PENICILLIN.   MEDICATIONS:  1. Lopressor 25 mg b.i.d.  2. Diltiazem ER 120 mg daily.  3. Lasix 4 mg daily.  4. Levothyroxine 100 mcg daily.  5. Potassium chloride 20 mEq daily.  6. Zocor 20 mg daily.  7. Coumadin 5 mg daily.  8. Amiodarone 200 mg t.i.d.   REVIEW OF SYSTEMS:  Also is reviewed and was negative as mentioned above  in his present illness.   PHYSICAL EXAMINATION:  VITAL SIGNS:  Afebrile, blood pressure is 130/70,  pulse is 50, respirations 89, saturation 100% on room air.  GENERAL:  No acute distress.  HEENT: Normocephalic, atraumatic.  Pupils are equal, round and reactive  to light and accommodation.  Extraocular movements are intact.  Oropharynx is pink and moist without any lesions.  Neck is supple.  No lymphadenopathy, no jugular venous distention.  No  masses.  CARDIOVASCULAR:  Bradycardic.  No murmurs, rubs or gallops.  LUNGS:  Clear to auscultation bilaterally.  ABDOMEN:  Positive bowel sounds.  Soft, nontender, nondistended.  EXTREMITIES:  No clubbing, cyanosis or edema.  Dorsalis pedis pulses 2+  bilaterally.  SKIN:  No rashes.  BACK:  No CVA tenderness.   DATA:  EKG shows sinus bradycardia with a first-degree AV block, normal  axis, normal QT interval.  No active signs of ischemia.   Pertinent laboratory data showed a troponin of less than 0.05, INR 2.8.  Normal renal function.  Elevated white blood cell count of 14.5,  otherwise normal.   Chest x-ray, stable cardiomegaly with mild pleural effusions.   ASSESSMENT/PLAN:  Ms. Banker is a 77 year old white female with a past  medical history significant for paroxysmal atrial fibrillation on  amiodarone and Coumadin therapy as well as hypertension, who presents  for evaluation of chest pain.   1. Admit the patient to  the [facility] Cardiology Service under Dr.      Yevonne Pax care.   1. Chest pain:  At this time, I think it is atypical for coronary      disease.  The patient does report been under a significant amount      of stress and anxiety since the death of her sister 3 weeks ago.  I      suspect this may be playing a role.  Regardless, she does have risk      factors for coronary disease, given her age and hypertension.      Therefore, we will admit the patient and rule her out for      myocardial infarction with serial cardiac enzymes and monitor on      telemetry and check serial EKGs.  Will start the patient on aspirin      325 mg daily.  Will continue on her home dose of Lopressor and      Zocor.  Should she rule out for myocardial infarction, I think she      would be a good candidate for a stress test which could be      performed either as an inpatient or an outpatient.   1. Paroxysmal atrial fibrillation.  She is currently in sinus rhythm      and is bradycardic.  I have taken the liberty of decreasing her      amiodarone from 200 mg t.i.d. to 200 mg daily.  We will discontinue      her Diltiazem, but we will continue her on Lopressor therapy.      Continue on Coumadin for systemic anticoagulation.   1. Hypothyroidism.  Continue thyroid replacement therapy with      levothyroxine.  Last week, her TSH was within normal limits, and it      was at 1.9.   1. Fluids, electrolytes, nutrition.  Saline IV fluids.  Electrolytes      are stable, n.p.o.   1. Deep venous thrombosis prophylaxis:  Not indicated, as the patient      is on systemic anticoagulation with Coumadin.      Therisa Doyne, MD  Electronically Signed     SJT/MEDQ  D:  10/27/2008  T:  10/27/2008  Job:  [id number]

## 2010-07-26 NOTE — Discharge Summary (Signed)
NAMEDECLYNN, LOPRESTI                ACCOUNT NO.:  [id number]   MEDICAL RECORD NO.:  [id number]          PATIENT TYPE:  INP   LOCATION:  3732                         FACILITY:  [facility]   PHYSICIAN:  Cassell Clement, M.D. DATE OF BIRTH:  1914-06-24   DATE OF ADMISSION:  10/26/2008  DATE OF DISCHARGE:  10/30/2008                               DISCHARGE SUMMARY   FINAL DIAGNOSES:  1. Acute-on-chronic diastolic congestive heart failure.  2. History of paroxysmal atrial fibrillation.  3. Cardiomegaly.  4. Pleural effusion and left basilar atelectasis.  5. Hepatic congestion with elevated liver enzymes.  6. History of hypothyroidism.  7. History of hypertensive cardiovascular disease.  8. Status post right mastectomy for breast cancer years ago.   OPERATIONS PERFORMED:  None.   HISTORY:  This 77 year old Caucasian female was admitted as an emergency  early in the morning of October 27, 2008, because of left-sided chest  pain, which was different than her usual angina pectoris.  She has been  under a lot of stress for the past 3 weeks when her sister died 3 weeks  ago while the patient was in her presence.  The patient has a history of  paroxysmal atrial fibrillation, but has recently been placed on  amiodarone 3 times a day, and has had no recurrent atrial fib.   PHYSICAL EXAMINATION ON ADMISSION:  VITAL SIGNS:  Stable.  Blood  pressure was 130/70, pulse was 50.  HEAD AND NECK:  Unremarkable.  HEART:  No murmur, gallop, rub, or click.  LUNGS:  Clear.  ABDOMEN:  Positive bowel sounds.  EXTREMITIES:  No phlebitis.  She has good pedal pulses.   HOSPITAL COURSE:  The patient was admitted to telemetry.  It was felt  that the likelihood of acute myocardial infarction was low, but serial  enzymes were cycled.  The patient's aspirin was increased to 325 mg  daily, and she was continued on home dose of Lopressor and Zocor.  Her  amiodarone, which had been 3 times a day was reduced to once a  day, and  she was taken off her diltiazem.  Her thyroid medication was continued.  Initial cardiac enzymes were negative.  EKG shows fluctuating anterior  wall T-wave changes.  It was felt that the patient was having some  angina pectoris, and Imdur was added to her regimen.  She was continued  on her Coumadin.  Her cardiac enzymes remained negative.  Her liver  enzymes were noted to be elevated, and this was felt to be possibly  secondary to Zocor or more likely secondary to amiodarone.  This should  improve on the lower dose of amiodarone, but her Zocor was held.  She  did complain of dyspnea with activity and her B-natriuretic peptide was  1200, and her Lasix was increased from 40-80 mg daily.  Her oxygen level  was normal on room air and she did not need home oxygen.  On the higher  dose of Lasix, her B-natriuretic peptide fell to 661 on August 19 and  fell to 590 on the day of discharge.  Her INR at  that time of discharge  is 2.0, BUN 15, creatinine 0.85, potassium 3.7, and sodium 136.  Liver  function studies improved in the hospital.  Her O2 sat on room air is  96%.  By August 20, she was felt stable enough to be discharged home.  Chest x-ray on August 19 still shows cardiomegaly and some atelectasis  or fluid at the left base.  We will follow this up as an outpatient.  She is afebrile and not coughing any sputum, and is not being sent home  on antibiotics.  Her white count is normal.   The patient will be seen at home by home health nurse.  She will draw  prothrombin times every Monday and Thursday.   DISCHARGE MEDICATIONS:  1. Coumadin 2.5 mg daily except 5 mg Sundays or as directed.  2. K-Dur 20 mEq daily.  3. Synthroid 100 mcg daily.  4. Metoprolol 25 mg twice a day.  5. Aspirin 325 daily.  6. Furosemide 40 mg 2 daily.  7. We will hold her Zocor for now, but we will probably restart it as      an outpatient after we get another set of liver function studies in       several weeks.  8. Amiodarone 200 mg 1 daily.  9. Imdur 60 mg daily.  10.Nitrostat 1/50 p.r.n.  11.Tylenol 325 two every 6 hours p.r.n. for nerves.   She was seen in the office by Dr. Patty Sermons in 2-3 weeks for office  visit, protime, and CMET.  She will be on a low-sodium heart-healthy  diet.   CONDITION ON DISCHARGE:  Improved.   Time spent in preparation of discharge papers and reviewing plans with  the patient greater than 30 minutes.           ______________________________  Cassell Clement, M.D.     TB/MEDQ  D:  10/30/2008  T:  10/30/2008  Job:  [id number]

## 2010-07-26 NOTE — Discharge Summary (Signed)
NAME:  ANGELL, PINCOCK                ACCOUNT NO.:  [id number]   MEDICAL RECORD NO.:  [id number]          PATIENT TYPE:  INP   LOCATION:  4714                         FACILITY:  [facility]   PHYSICIAN:  Peter M. Swaziland, M.D.  DATE OF BIRTH:  November 14, 1914   DATE OF ADMISSION:  10/18/2008  DATE OF DISCHARGE:  10/21/2008                               DISCHARGE SUMMARY   HISTORY OF PRESENT ILLNESS:  Ms. [name] is a 77 year old white female,  who has a history of hypertension, hypothyroidism, and recurrent atrial  fibrillation.  In fact, she was admitted in January 2010 with new onset  of atrial fibrillation and congestive heart failure due to diastolic  dysfunction.  She converted spontaneously to sinus rhythm.  She was seen  in July 2010 and had recurrent atrial fibrillation.  She was treated  medically.  She presented to the emergency room on this occasion with  increasing shortness of breath over a 2-day period associated with  orthopnea.  On presentation to the emergency room, she was in atrial  fibrillation with rapid ventricular response with a rate of 145.  Chest  x-ray demonstrated evidence of congestive heart failure and she was  admitted for further management.  For details of her past medical  history, social history, family history, and physical exam, please see  admission history and physical.   LABORATORY DATA:  Her chest x-ray showed bilateral lower lobe opacities,  right greater than left.  There was mild cardiomegaly.  ECG demonstrated  atrial fibrillation with a rapid ventricular response.  There was  nonspecific T-wave abnormality.  Initial cardiac markers were negative.  CBC was normal.  Chemistry panel showed a sodium 138, potassium 3.9,  chloride 107, CO2 of 24, BUN was 26 with creatinine 1.85, glucose was  155, calcium was 8.4.  BNP level was elevated at 586.  Protime was 30  with an INR of 2.9, magnesium was 2.1.  Urinalysis was negative.  Lipid  panel showed total  cholesterol of 124 with triglycerides of 70, HDL was  51, LDL was 59.  TSH was 1.978.  Free T4 was 1.4.  She had surveillance  for MRSA with a nasal swab which was positive.   HOSPITAL COURSE:  The patient was admitted to step-down initially.  She  was treated with IV diltiazem for rate control.  She was diuresed with  IV Lasix.  She had a prompt diuretic response with improvement in her  symptoms of shortness of breath.  Her BNP level initially increased to  614 before declining further to 224.  She was started on Bactroban Nasal  ointment for her MRSA.  She was started on oral amiodarone given her  symptomatic atrial fibrillation.  She was subsequently converted back to  oral Lasix at a higher dose than what she had been on previously at 40  mg per day.  Her potassium was continued.  We switched her to oral  diltiazem SR.  With a combination of metoprolol, amiodarone, and  diltiazem, she demonstrated good rate control.  We stopped her  lisinopril given her worsening renal insufficiency  and subsequent renal  function returned to normal with a BUN of 20 and a creatinine of 0.95.  Initially her INR remained stable at 2.6.  However, on third hospital  day, her INR increased to 4.1 with a protime of 39.1.  This was felt to  be related to the interaction with amiodarone and Coumadin.  We  recommended holding her Coumadin for 2 days and then rechecking her INR.  The patient was ambulatory at this point and had no symptoms of dyspnea.  She had good rate control.  It was felt that she was stable for  discharge.  She will be followed up closely as an outpatient.  If with  further loading of amiodarone, she does not convert to normal sinus  rhythm, she would be a candidate for outpatient elective cardioversion.   DISCHARGE DIAGNOSES:  1. Persistent atrial fibrillation with a rapid ventricular response.  2. Congestive heart failure, acute due to diastolic dysfunction and      atrial  fibrillation.  3. Renal insufficiency, resolved.  4. Methicillin-resistant Staphylococcus aureus, positive nasal swab.  5. History of hypothyroidism.  6. Hypertension.  7. Anticoagulation with Coumadin.   DISCHARGE MEDICATIONS:  We will hold Coumadin to Wednesday and Thursday.  The patient will come to our office on Friday, October 23, 2008, for an  INR and will require lower dose of Coumadin.  On admission, she was  taking 5 mg daily.   Other medications include:  1. Simvastatin 20 mg per day.  2. Levothyroxine 100 mcg daily.  3. Multivitamin daily.  4. Metoprolol 25 mg twice a day.  5. Lasix 40 mg per day.  6. Potassium 20 mEq per day.  7. Amiodarone 200 mg 3 times a day.  8. Diltiazem SR 120 mg daily.  9. Bactroban 2% ointment to her nares twice daily for total of 5 days.   She is instructed to stop her lisinopril.  She will follow up with Dr.  Patty Sermons in 1-2 weeks.   DISCHARGE STATUS:  Improved.           ______________________________  Peter M. [place]il[place], M.D.     PMJ/MEDQ  D:  10/21/2008  T:  10/21/2008  Job:  [id number]   cc:   Cassell Clement, M.D.

## 2010-07-26 NOTE — H&P (Signed)
[name], [name]                ACCOUNT NO.:  [id number]   MEDICAL RECORD NO.:  [id number]          PATIENT TYPE:  INP   LOCATION:  2104                         FACILITY:  [facility]   PHYSICIAN:  Bevelyn Buckles. Bensimhon, MDDATE OF BIRTH:  [birth year]   DATE OF ADMISSION:  10/19/2008  DATE OF DISCHARGE:                              HISTORY & PHYSICAL   PRIMARY CARE PHYSICIAN AND CARDIOLOGIST:  Dr. Ronny Flurry.   REASON FOR ADMISSION:  Atrial fibrillation with rapid ventricular  response and acute on chronic diastolic heart failure.   Ms. [name] is a delightful 77 year old woman who looks much younger than  her stated age.  She has a history of hypertension, hyperlipidemia,  hypothyroidism of pulmonary nodule.  She was admitted in January 2010  with new-onset atrial fibrillation complicated by diastolic heart  failure.  She was treated with diltiazem and IV Lasix.  She converted to  sinus rhythm day or two later spontaneously.  She was discharged home on  Coumadin.   For the past day or two, she has noticed increased shortness of breath  and some palpitations.  She notes that her shortness of breath is  particularly worse when she bends over or exerts herself significantly.  She has also had orthopnea.  She denies any chest pain.   She was seen in the emergency room.  She was found to be a recurrent  atrial fibrillation with rapid ventricular response with a ventricular  rate of 140.  Chest x-ray showed bilateral infiltrates, pneumonia versus  CHF.  Her BNP was elevated up from 265 at baseline to 586.  She was  treated with IV Cardizem and 0.25 of digoxin.  A heart rate then slowed  down to 100.   REVIEW OF SYSTEMS:  She denied any fevers, chills, no nausea, no  vomiting.  No focal neurologic signs.  No bleeding.  She does have some  leg swelling which is worse at night but goes away in the mornings.  She  denies PND.  Remainder of review of systems is negative except for HPI  and  problem list.   PROBLEM LIST:  1. Paroxysmal atrial fibrillation..  First episode in January 2010.  2. Diastolic heart failure.  Echocardiogram in November 2090 showed an      EF of 60-65% with moderate tricuspid regurgitation and pulmonary      hypertension.  3. History of pulmonary nodule evaluated previously as PET scan, being      treated medically.  4. Hypertension.  5. Hyperlipidemia.  6. Hypothyroidism.   CURRENT MEDICATIONS:  1. Potassium 10 mEq a day.  2. Lasix 20 mg a day.  3. Lisinopril 20 a day.  4. Metoprolol 25 b.i.d.  5. Synthroid 100 mcg a day.  6. Simvastatin 20 mg a day.  7. Coumadin 5 mg a day titrated to an INR of 2.0-3.0.   ALLERGIES:  PENICILLIN, TETRACYCLINE AND SEPTRA.   SOCIAL HISTORY:  She is widowed.  She is a retired Interior and spatial designer.  She  denies use of alcohol or tobacco.  She lives right near her son who  looks after her.   FAMILY HISTORY:  Notable for a stroke and her father died at 28.  Mother  died of pneumonia at 26.  She had been a widow for many years.   PHYSICAL EXAMINATION:  GENERAL:  She is an elderly woman who looks  younger than stated age.  VITAL SIGNS:  Blood pressure is 116/81, heart rate currently 110.  She  is afebrile.  HEENT:  Normal.  NECK:  Supple.  JVP is elevated to the angle of the jaw.  Carotids are  1+ bilaterally without bruits.  There is no lymphadenopathy or  thyromegaly appreciated.  CARDIAC:  PMI is nondisplaced.  She is tachycardiac and irregular.  No  obvious murmurs.  LUNGS:  Bibasilar crackles.  ABDOMEN: Soft, nontender, nondistended.  No appreciable  hepatosplenomegaly, no bruits.  No masses.  EXTREMITIES:  Warm.  No cyanosis or clubbing.  There is 1+ edema  bilaterally.  No rash.  NEUROLOGICAL:  Alert and oriented x3.  Cranial nerves II-XII are intact.  Moves all fours without difficulty.  Affect is pleasant.   STUDIES:  EKG shows atrial fibrillation at a rate of 145, no ST-T wave  abnormalities.  Chest  x-ray shows bibasilar infiltrates infection versus  heart failure.  Sodium 138, potassium 3.9, creatinine 1.85 which is up  from 0.69 on October 05, 2008.  INR is 2.9.  Cardiac markers are normal.   ASSESSMENT:  1. Recurrent paroxysmal atrial fibrillation with rapid ventricular      response.  2. Acute on chronic diastolic heart failure with an EF of 60-65%.  3. Acute renal insufficiency.  Suspect a low flow state.  4. Pulmonary nodule.   PLAN:  Will be admitted to step-down.  We will rate control with IV  diltiazem and treat her with Lasix.  We will put a Foley in overnight.  We will watch her renal function closely.  Will check her thyroid panel.  Could consider possible amiodarone given that this is her second  symptomatic episode in 6 months.  I will leave this discretion to Dr.  Patty Sermons.      Bevelyn Buckles. Bensimhon, MD  Electronically Signed     Bevelyn Buckles. Bensimhon, MD  Electronically Signed    DRB/MEDQ  D:  10/19/2008  T:  10/19/2008  Job:  [id number]

## 2010-07-27 ENCOUNTER — Ambulatory Visit (INDEPENDENT_AMBULATORY_CARE_PROVIDER_SITE_OTHER): Payer: Medicare Other | Admitting: *Deleted

## 2010-07-27 ENCOUNTER — Other Ambulatory Visit: Payer: Medicare Other | Admitting: *Deleted

## 2010-07-27 DIAGNOSIS — I4891 Unspecified atrial fibrillation: Secondary | ICD-10-CM

## 2010-07-27 LAB — POCT INR: INR: 3.3

## 2010-07-29 NOTE — Op Note (Signed)
[place]  Patient:    [name], [name]                       MRN: [id number] Proc. Date: 03/19/00 Adm. Date:  [id number] Attending:  Chapman Moss                           Operative Report  PREOPERATIVE DIAGNOSIS:  Basal cell carcinoma right upper lip greater than 0.5 cm.  POSTOPERATIVE DIAGNOSES: 1. Basal cell carcinoma right upper lip greater than 0.5 cm. 2. Complex open wound of the right upper lip greater than 1.5 cm.  PROCEDURE: 1. Excision basal cell carcinoma greater than 0.5 cm. 2. Complex wound closure right upper lip greater than 1.5 cm.  SURGEON:  Teena Irani. Odis Luster, M.D.  ANESTHESIA:  1% Xylocaine with epinephrine.  CLINICAL NOTE:  An 77 year old woman with a basal cell carcinoma of the right upper lip which was biopsied proven by her dermatologist and she is referred for a wider excision of this area.  The nature, procedures, and risk and possible complications were discussed with her at length and she understood all of these as well as the possibility of the further surgery, pending margins on the final surgical specimen, and wished to proceed.  PROCEDURE:  The patient was taken to the operating room and placed in a head up position.  The elliptical excision was then marked in a vertical direction under loupe magnification.  She was then prepped with Betadine and draped in sterile drapes.  Satisfactory local anesthesia was achieved.  The elliptical excision was performed and the specimen tagged at its lateral margin and removed as a permanent specimen.  The wound was irrigated thoroughly.  Good hemostasis having been confirmed the closure was performed in layers.  The muscular layer with 5-0 Vicryl interrupted running deep sutures, subcutaneous layer with 5-0 Vicryl interrupted running deep sutures, and the skin with 6-0 Prolene sutures. Antibiotic ointment was applied and she tolerated the procedure  well.  DISPOSITION:  She will resume her aspirin this weekend.  Suture removal next week.  Keep the wound dry for 24 hours and then may wash.  She may apply ointment several times a day for the next couple of days. DD:  03/19/00 TD:  03/19/00 Job: [id number]

## 2010-08-11 ENCOUNTER — Ambulatory Visit (INDEPENDENT_AMBULATORY_CARE_PROVIDER_SITE_OTHER): Payer: Medicare Other | Admitting: *Deleted

## 2010-08-11 DIAGNOSIS — I4891 Unspecified atrial fibrillation: Secondary | ICD-10-CM

## 2010-08-11 LAB — POCT INR: INR: 2

## 2010-08-22 ENCOUNTER — Encounter: Payer: Self-pay | Admitting: Cardiology

## 2010-08-25 ENCOUNTER — Other Ambulatory Visit: Payer: Medicare Other | Admitting: *Deleted

## 2010-09-08 ENCOUNTER — Ambulatory Visit (INDEPENDENT_AMBULATORY_CARE_PROVIDER_SITE_OTHER): Payer: Medicare Other | Admitting: *Deleted

## 2010-09-08 DIAGNOSIS — I4891 Unspecified atrial fibrillation: Secondary | ICD-10-CM

## 2010-09-08 LAB — POCT INR: INR: 1.8

## 2010-09-09 ENCOUNTER — Other Ambulatory Visit: Payer: Self-pay | Admitting: *Deleted

## 2010-09-09 ENCOUNTER — Telehealth: Payer: Self-pay | Admitting: *Deleted

## 2010-09-09 DIAGNOSIS — Z79899 Other long term (current) drug therapy: Secondary | ICD-10-CM

## 2010-09-09 NOTE — Telephone Encounter (Signed)
Advised patient Dr. Patty Sermons would discuss symptoms re-abd. Issue at her ov in August

## 2010-09-12 ENCOUNTER — Telehealth: Payer: Self-pay | Admitting: Cardiology

## 2010-09-12 NOTE — Telephone Encounter (Signed)
Patient phoned c/o hip and back pain late night and had a difficult time sleeping.  Did get up and take 1 tylenol and a couple of hours later and took another tylenol.  States pain is lower right back pain above the hip.  Feels some better today.  Advised to continue tylenol prn and to contact her orthopedic doctor, which she states she has seen one in the past.  Patient to call back and let us know appointment has been made

## 2010-09-12 NOTE — Telephone Encounter (Signed)
Agree with plan 

## 2010-09-12 NOTE — Telephone Encounter (Signed)
Pt called said she has been having back and hip pain couldn't sleep last night please call

## 2010-09-13 ENCOUNTER — Other Ambulatory Visit: Payer: Self-pay | Admitting: *Deleted

## 2010-09-13 ENCOUNTER — Telehealth: Payer: Self-pay | Admitting: Cardiology

## 2010-09-13 MED ORDER — DILTIAZEM HCL ER COATED BEADS 300 MG PO CP24: 300.0000 mg | ORAL_CAPSULE | Freq: Every day | ORAL | Status: DC

## 2010-09-13 MED ORDER — DIGOXIN 125 MCG PO TABS: 125.0000 ug | ORAL_TABLET | Freq: Every day | ORAL | Status: DC

## 2010-09-13 NOTE — Telephone Encounter (Signed)
Refilled meds per fax request.  

## 2010-09-13 NOTE — Telephone Encounter (Signed)
Called in wanting to let you know that she went to [facility] on [address], because the orthopedic doctors could not work her into their schedules yesterday, and they took X-Rays of her hip and lower back, and the physicians on call said that they couldn't find anything wrong and that her X-Rays looked good. She says that she feels fine today and just wanted check in. Please call back. I have pulled her chart.

## 2010-09-13 NOTE — Telephone Encounter (Signed)
Spoke with patient and stated doing much better today.  FYI

## 2010-10-06 ENCOUNTER — Ambulatory Visit (INDEPENDENT_AMBULATORY_CARE_PROVIDER_SITE_OTHER): Payer: Medicare Other | Admitting: *Deleted

## 2010-10-06 DIAGNOSIS — I4891 Unspecified atrial fibrillation: Secondary | ICD-10-CM

## 2010-10-06 LAB — POCT INR: INR: 2.2

## 2010-10-25 ENCOUNTER — Ambulatory Visit (INDEPENDENT_AMBULATORY_CARE_PROVIDER_SITE_OTHER): Payer: Medicare Other | Admitting: *Deleted

## 2010-10-25 ENCOUNTER — Other Ambulatory Visit (INDEPENDENT_AMBULATORY_CARE_PROVIDER_SITE_OTHER): Payer: Medicare Other | Admitting: *Deleted

## 2010-10-25 ENCOUNTER — Encounter: Payer: Self-pay | Admitting: Cardiology

## 2010-10-25 ENCOUNTER — Ambulatory Visit (INDEPENDENT_AMBULATORY_CARE_PROVIDER_SITE_OTHER): Payer: Medicare Other | Admitting: Cardiology

## 2010-10-25 VITALS — BP 130/70 | HR 63 | Ht 63.0 in | Wt 105.4 lb

## 2010-10-25 DIAGNOSIS — I4891 Unspecified atrial fibrillation: Secondary | ICD-10-CM

## 2010-10-25 DIAGNOSIS — I10 Essential (primary) hypertension: Secondary | ICD-10-CM

## 2010-10-25 DIAGNOSIS — Z79899 Other long term (current) drug therapy: Secondary | ICD-10-CM

## 2010-10-25 DIAGNOSIS — E039 Hypothyroidism, unspecified: Secondary | ICD-10-CM

## 2010-10-25 LAB — CBC WITH DIFFERENTIAL/PLATELET
Basophils Relative: 0.8 % (ref 0.0–3.0)
Eosinophils Relative: 1 % (ref 0.0–5.0)
Lymphocytes Relative: 17.7 % (ref 12.0–46.0)
Monocytes Relative: 9.7 % (ref 3.0–12.0)
Neutrophils Relative %: 70.8 % (ref 43.0–77.0)
Platelets: 261 10*3/uL (ref 150.0–400.0)
RBC: 4.46 Mil/uL (ref 3.87–5.11)
WBC: 5.7 10*3/uL (ref 4.5–10.5)

## 2010-10-25 LAB — HEPATIC FUNCTION PANEL
ALT: 19 U/L (ref 0–35)
Alkaline Phosphatase: 95 U/L (ref 39–117)
Bilirubin, Direct: 0.1 mg/dL (ref 0.0–0.3)
Total Bilirubin: 0.6 mg/dL (ref 0.3–1.2)
Total Protein: 7 g/dL (ref 6.0–8.3)

## 2010-10-25 NOTE — Assessment & Plan Note (Signed)
The patient has not been having any headaches or dizziness or other symptoms of high blood pressure

## 2010-10-25 NOTE — Assessment & Plan Note (Addendum)
Her Heart rate remains well controlled on her present regimen.  No thromboembolic symptoms .

## 2010-10-25 NOTE — Assessment & Plan Note (Signed)
The patient remains on Synthroid and is clinically euthyroid

## 2010-10-25 NOTE — Progress Notes (Signed)
[name] Date of Birth:  [birth year] [facility] / [facility] [address]   [address] [place], [place]  [address] [phone]           Fax   [phone]  HPI: This pleasant 77 year old woman is seen for a scheduled followup office visit.  She has a history of atrial fibrillation and a past history of tachybradycardia syndrome.  He does not do well when she is in normal sinus rhythm because of extreme bradycardia.  She has done better by controlling her ventricular response Her stay in atrial fibrillation.  She remains on long-term Coumadin.  She has not had any falls.  She denies any chest pain or shortness of breath.  She does not have any history of ischemic heart disease.  She had a normal adenosine Cardiolite stress test on 12/27/06 showing an ejection fraction of 73% and no ischemia.  She had an echocardiogram on 01/29/08 showing an ejection fraction of 60-65% with biatrial enlargement, mild aortic stenosis and mild aortic insufficiency, mild tricuspid regurgitation with moderate pulmonary hypertension and moderate mitral regurgitation.  She remains on low dose daily Lasix and has not been experiencing any symptoms of congestive heart failure.  She has been losing weight.  She has not been having a localized gastrointestinal symptoms.  Her appetite remains good.  She has not had any symptoms of hyperthyroidism.  Current Outpatient Prescriptions  Medication Sig Dispense Refill  . aspirin 81 MG tablet Take 81 mg by mouth daily.        . chlordiazePOXIDE (LIBRIUM) 5 MG capsule Take 5 mg by mouth daily as needed. For nerves       . digoxin (LANOXIN) 0.125 MG tablet Take 1 tablet (125 mcg total) by mouth daily.  30 tablet  11  . diltiazem (CARDIZEM CD) 300 MG 24 hr capsule Take 1 capsule (300 mg total) by mouth daily.  30 capsule  11  . furosemide (LASIX) 40 MG tablet Take 40 mg by mouth daily.        . isosorbide mononitrate (IMDUR) 60 MG 24 hr tablet Take 60 mg by mouth  daily.        Marland Kitchen levothyroxine (SYNTHROID, LEVOTHROID) 100 MCG tablet Take 1 tablet (100 mcg total) by mouth daily.  31 tablet  11  . METOPROLOL TARTRATE PO Take by mouth. 100 mg  One half bid      . nitroGLYCERIN (NITROSTAT) 0.4 MG SL tablet Place 0.4 mg under the tongue every 5 (five) minutes as needed. For chest pains       . potassium chloride SA (K-DUR,KLOR-CON) 20 MEQ tablet Take 20 mEq by mouth every other day.        . warfarin (COUMADIN) 5 MG tablet Take 5 mg by mouth as directed. Taking 5mg  one day and 2.5 6 days or as directed        Allergies  Allergen Reactions  . Penicillins   . Pepcid   . Sulfa Antibiotics   . Tetracyclines & Related     Patient Active Problem List  Diagnoses  . Atrial fibrillation with controlled ventricular response  . Hypothyroidism  . Hypertension    History  Smoking status  . Never Smoker   Smokeless tobacco  . Not on file    History  Alcohol Use     No family history on file.  Review of Systems: The patient denies any heat or cold intolerance.  No weight gain or weight loss.  The  patient denies headaches or blurry vision.  There is no cough or sputum production.  The patient denies dizziness.  There is no hematuria or hematochezia.  The patient denies any muscle aches or arthritis.  The patient denies any rash.  The patient denies frequent falling or instability.  There is no history of depression or anxiety.  All other systems were reviewed and are negative.   Physical Exam: Filed Vitals:   10/25/10 1056  BP: 130/70  Pulse: 63  The general appearance reveals a well-developed elderly woman who is alert and appears younger than her stated age.Pupils equal and reactive.   Extraocular Movements are full.  There is no scleral icterus.  The mouth and pharynx are normal.  The neck is supple.  The carotids reveal no bruits.  The jugular venous pressure is normal.  The thyroid is not enlarged.  There is no lymphadenopathy.  The chest is  clear to percussion and auscultation. There are no rales or rhonchi. Expansion of the chest is symmetrical.  The precordium is quiet.  The first heart sound is normal.  The second heart sound is physiologically split.  There is no murmur gallop rub or click.  There is no abnormal lift or heave.The rhythm is irregular The abdomen is soft and nontender. Bowel sounds are normal. The liver and spleen are not enlarged. There Are no abdominal masses. There are no bruits.  Normal extremity without phlebitis or edema.Strength is normal and symmetrical in all extremities.  There is no lateralizing weakness.  There are no sensory deficits.  Her electrocardiogram today shows atrial fibrillation with a controlled ventricular response.  She has left axis deviation and nonspecific ST-T wave abnormalities.      Assessment / Plan:  Continue same medication.  Recheck in 3 months.

## 2010-10-27 ENCOUNTER — Telehealth: Payer: Self-pay | Admitting: *Deleted

## 2010-10-27 ENCOUNTER — Encounter: Payer: Self-pay | Admitting: Cardiology

## 2010-10-27 NOTE — Telephone Encounter (Signed)
Advised patient of labs 

## 2010-10-27 NOTE — Telephone Encounter (Signed)
Message copied by Eugenia Pancoast on Thu Oct 27, 2010  4:17 PM ------      Message from: Cassell Clement      Created: Wed Oct 26, 2010  3:10 PM       Please report.  The CBC is normal.  She is not anemic.  The liver tests are normal.Continue same meds

## 2010-11-01 ENCOUNTER — Telehealth: Payer: Self-pay | Admitting: Cardiology

## 2010-11-01 NOTE — Telephone Encounter (Signed)
Patient would like to speak with RN about her foot pain.  She said that she lives close to Dr.Elkins in [place] so she is not opposed to seeing him.

## 2010-11-01 NOTE — Telephone Encounter (Signed)
Has called again in regards to her ankle. She thought that she would be speaking with Suzy. Please call back.

## 2010-11-01 NOTE — Telephone Encounter (Signed)
Advised ok to have Dr Jeannetta Nap as her doctor.  Hurt her foot about 2 weeks ago, and started swelling yesterday.  Put ice on it and will see Dr Jeannetta Nap

## 2010-11-07 ENCOUNTER — Telehealth: Payer: Self-pay | Admitting: Cardiology

## 2010-11-07 NOTE — Telephone Encounter (Signed)
Scheduled coumadin check

## 2010-11-07 NOTE — Telephone Encounter (Signed)
Pt wants to know when her next coumadin check is please call

## 2010-11-23 ENCOUNTER — Ambulatory Visit (INDEPENDENT_AMBULATORY_CARE_PROVIDER_SITE_OTHER): Payer: Medicare Other | Admitting: *Deleted

## 2010-11-23 ENCOUNTER — Other Ambulatory Visit: Payer: Self-pay | Admitting: Cardiology

## 2010-11-23 DIAGNOSIS — F419 Anxiety disorder, unspecified: Secondary | ICD-10-CM

## 2010-11-23 DIAGNOSIS — I4891 Unspecified atrial fibrillation: Secondary | ICD-10-CM

## 2010-11-23 DIAGNOSIS — IMO0001 Reserved for inherently not codable concepts without codable children: Secondary | ICD-10-CM

## 2010-11-23 LAB — POCT INR: INR: 1.8

## 2010-11-23 MED ORDER — CHLORDIAZEPOXIDE HCL 5 MG PO CAPS: 5.0000 mg | ORAL_CAPSULE | Freq: Every day | ORAL | Status: DC | PRN

## 2010-11-23 NOTE — Telephone Encounter (Signed)
Refilled librium

## 2010-11-23 NOTE — Telephone Encounter (Signed)
Pt called and now pt son calling to check status of pt refill

## 2010-12-14 ENCOUNTER — Ambulatory Visit (INDEPENDENT_AMBULATORY_CARE_PROVIDER_SITE_OTHER): Payer: Medicare Other | Admitting: *Deleted

## 2010-12-14 DIAGNOSIS — Z79899 Other long term (current) drug therapy: Secondary | ICD-10-CM

## 2010-12-14 DIAGNOSIS — I4891 Unspecified atrial fibrillation: Secondary | ICD-10-CM

## 2011-01-05 ENCOUNTER — Ambulatory Visit (INDEPENDENT_AMBULATORY_CARE_PROVIDER_SITE_OTHER): Payer: Medicare Other | Admitting: *Deleted

## 2011-01-05 DIAGNOSIS — I4891 Unspecified atrial fibrillation: Secondary | ICD-10-CM

## 2011-01-05 DIAGNOSIS — Z79899 Other long term (current) drug therapy: Secondary | ICD-10-CM

## 2011-01-25 ENCOUNTER — Other Ambulatory Visit: Payer: Self-pay | Admitting: *Deleted

## 2011-01-25 ENCOUNTER — Ambulatory Visit (INDEPENDENT_AMBULATORY_CARE_PROVIDER_SITE_OTHER): Payer: Medicare Other | Admitting: *Deleted

## 2011-01-25 ENCOUNTER — Ambulatory Visit (INDEPENDENT_AMBULATORY_CARE_PROVIDER_SITE_OTHER): Payer: Medicare Other | Admitting: Cardiology

## 2011-01-25 ENCOUNTER — Encounter: Payer: Self-pay | Admitting: Cardiology

## 2011-01-25 DIAGNOSIS — I4891 Unspecified atrial fibrillation: Secondary | ICD-10-CM

## 2011-01-25 DIAGNOSIS — I119 Hypertensive heart disease without heart failure: Secondary | ICD-10-CM

## 2011-01-25 DIAGNOSIS — Z79899 Other long term (current) drug therapy: Secondary | ICD-10-CM

## 2011-01-25 DIAGNOSIS — R634 Abnormal weight loss: Secondary | ICD-10-CM | POA: Insufficient documentation

## 2011-01-25 LAB — POCT INR: INR: 1.6

## 2011-01-25 MED ORDER — FUROSEMIDE 40 MG PO TABS: 40.0000 mg | ORAL_TABLET | Freq: Every day | ORAL | Status: DC

## 2011-01-25 NOTE — Patient Instructions (Signed)
Your physician recommends that you continue on your current medications as directed. Please refer to the Current Medication list given to you today.  Your physician recommends that you schedule a follow-up appointment in: 3 months  

## 2011-01-25 NOTE — Progress Notes (Signed)
[name] Date of Birth:  05/21/1914 [facility] / [facility] [address]   [address] [place], [place]  [address] [phone]           Fax   [phone]  HPI: This pleasant 77 year old woman is seen for a scheduled followup office visit.  She has a history of atrial fibrillation and a past history of tachybradycardia syndrome.  He does not do well when she is in normal sinus rhythm because of extreme bradycardia.  She has done better by controlling her ventricular response Her stay in atrial fibrillation.  She remains on long-term Coumadin.  She has not had any falls.  She denies any chest pain or shortness of breath.  She does not have any history of ischemic heart disease.  She had a normal adenosine Cardiolite stress test on 12/27/06 showing an ejection fraction of 73% and no ischemia.  She had an echocardiogram on 01/29/08 showing an ejection fraction of 60-65% with biatrial enlargement, mild aortic stenosis and mild aortic insufficiency, mild tricuspid regurgitation with moderate pulmonary hypertension and moderate mitral regurgitation.  She remains on low dose daily Lasix and has not been experiencing any symptoms of congestive heart failure.  She has been losing weight.  She has not been having a localized gastrointestinal symptoms.  Her appetite remains good.  She has not had any symptoms of hyperthyroidism.  She has continued to lose weight slowly.  She is not having any GI issues.  Her appetite is good and she eats 3 meals a day  Current Outpatient Prescriptions  Medication Sig Dispense Refill  . aspirin 81 MG tablet Take 81 mg by mouth daily.        . chlordiazePOXIDE (LIBRIUM) 5 MG capsule Take 1 capsule (5 mg total) by mouth daily as needed. For nerves  30 capsule  5  . digoxin (LANOXIN) 0.125 MG tablet Take 1 tablet (125 mcg total) by mouth daily.  30 tablet  11  . diltiazem (CARDIZEM CD) 300 MG 24 hr capsule Take 1 capsule (300 mg total) by mouth daily.  30  capsule  11  . furosemide (LASIX) 40 MG tablet Take 40 mg by mouth daily.        . isosorbide mononitrate (IMDUR) 60 MG 24 hr tablet Take 60 mg by mouth daily.        Marland Kitchen levothyroxine (SYNTHROID, LEVOTHROID) 100 MCG tablet Take 1 tablet (100 mcg total) by mouth daily.  31 tablet  11  . METOPROLOL TARTRATE PO Take by mouth. 100 mg  One half bid      . nitroGLYCERIN (NITROSTAT) 0.4 MG SL tablet Place 0.4 mg under the tongue every 5 (five) minutes as needed. For chest pains       . potassium chloride SA (K-DUR,KLOR-CON) 20 MEQ tablet Take 20 mEq by mouth every other day.        . warfarin (COUMADIN) 5 MG tablet Take 5 mg by mouth as directed. Taking 5mg  one day and 2.5 6 days or as directed        Allergies  Allergen Reactions  . Penicillins   . Pepcid   . Sulfa Antibiotics   . Tetracyclines & Related     Patient Active Problem List  Diagnoses  . Atrial fibrillation with controlled ventricular response  . Hypothyroidism  . Hypertension  . Weight loss, non-intentional    History  Smoking status  . Never Smoker   Smokeless tobacco  . Not on file  History  Alcohol Use     No family history on file.  Review of Systems: The patient denies any heat or cold intolerance.  No weight gain or weight loss.  The patient denies headaches or blurry vision.  There is no cough or sputum production.  The patient denies dizziness.  There is no hematuria or hematochezia.  The patient denies any muscle aches or arthritis.  The patient denies any rash.  The patient denies frequent falling or instability.  There is no history of depression or anxiety.  All other systems were reviewed and are negative.   Physical Exam: Filed Vitals:   01/25/11 1108  BP: 110/68  Pulse: 70  The general appearance reveals a well-developed elderly woman who is alert and appears younger than her stated age.Pupils equal and reactive.   Extraocular Movements are full.  There is no scleral icterus.  The mouth and  pharynx are normal.  The neck is supple.  The carotids reveal no bruits.  The jugular venous pressure is normal.  The thyroid is not enlarged.  There is no lymphadenopathy.  The chest is clear to percussion and auscultation. There are no rales or rhonchi. Expansion of the chest is symmetrical.  The precordium is quiet.  The first heart sound is normal.  The second heart sound is physiologically split.  There is no murmur gallop rub or click.  There is no abnormal lift or heave.The rhythm is irregular The abdomen is soft and nontender. Bowel sounds are normal. The liver and spleen are not enlarged. There Are no abdominal masses. There are no bruits.  Normal extremity without phlebitis or edema.Strength is normal and symmetrical in all extremities.  There is no lateralizing weakness.  There are no sensory deficits.      Assessment / Plan:  Continue same medication.  Recheck in 3 months.  Her daughter is subtherapeutic today and she will be returning in 2 weeks for followup INR.  Today.  No change in meds.  Recheck in 3 months

## 2011-01-26 ENCOUNTER — Telehealth: Payer: Self-pay | Admitting: Cardiology

## 2011-01-26 NOTE — Telephone Encounter (Signed)
Pt calling re appt yesterday, can't remember what you told her , can you call her and tell her again

## 2011-02-01 ENCOUNTER — Encounter: Payer: Medicare Other | Admitting: *Deleted

## 2011-02-08 ENCOUNTER — Ambulatory Visit (INDEPENDENT_AMBULATORY_CARE_PROVIDER_SITE_OTHER): Payer: Medicare Other | Admitting: *Deleted

## 2011-02-08 DIAGNOSIS — I4891 Unspecified atrial fibrillation: Secondary | ICD-10-CM

## 2011-02-08 DIAGNOSIS — Z79899 Other long term (current) drug therapy: Secondary | ICD-10-CM

## 2011-02-08 LAB — POCT INR: INR: 2.2

## 2011-03-01 ENCOUNTER — Ambulatory Visit (INDEPENDENT_AMBULATORY_CARE_PROVIDER_SITE_OTHER): Payer: Medicare Other | Admitting: *Deleted

## 2011-03-01 DIAGNOSIS — I4891 Unspecified atrial fibrillation: Secondary | ICD-10-CM

## 2011-03-01 DIAGNOSIS — Z7901 Long term (current) use of anticoagulants: Secondary | ICD-10-CM

## 2011-03-01 DIAGNOSIS — Z79899 Other long term (current) drug therapy: Secondary | ICD-10-CM

## 2011-03-01 LAB — POCT INR: INR: 1.5

## 2011-03-08 ENCOUNTER — Other Ambulatory Visit: Payer: Self-pay | Admitting: *Deleted

## 2011-03-08 MED ORDER — METOPROLOL SUCCINATE ER 100 MG PO TB24: 50.0000 mg | ORAL_TABLET | Freq: Two times a day (BID) | ORAL | Status: DC

## 2011-03-15 ENCOUNTER — Ambulatory Visit (INDEPENDENT_AMBULATORY_CARE_PROVIDER_SITE_OTHER): Payer: Medicare Other | Admitting: *Deleted

## 2011-03-15 DIAGNOSIS — I4891 Unspecified atrial fibrillation: Secondary | ICD-10-CM

## 2011-03-15 DIAGNOSIS — Z79899 Other long term (current) drug therapy: Secondary | ICD-10-CM

## 2011-03-15 NOTE — Patient Instructions (Signed)
Reviewed foods with vitamin k and consistency with amount per week

## 2011-03-21 ENCOUNTER — Telehealth: Payer: Self-pay | Admitting: Cardiology

## 2011-03-21 NOTE — Telephone Encounter (Signed)
New problem Pt said she is hurting in her left shoulder for a couple of weeks, no sob, no chest pain. Please call

## 2011-03-21 NOTE — Telephone Encounter (Signed)
Could be bursitis or arthritis.  Suggest we send her to orthopedist [Dr. Darrelyn Hillock if no other preference] who could also do an xray.

## 2011-03-21 NOTE — Telephone Encounter (Signed)
Mrs Donofrio is reporting that her left shoulder has been hurting x one week.  She mostly has the pain when she moves it, occasionally has it when not moving it.  The pain is at the top of her shoulder.  No other symptoms.  She states she does not have a pcp and is requesting that Dr Patty Sermons address this issue when he is back in the office.

## 2011-03-22 ENCOUNTER — Telehealth: Payer: Self-pay | Admitting: Cardiology

## 2011-03-22 NOTE — Telephone Encounter (Signed)
Has seen orthopedic doctor and will call them

## 2011-03-22 NOTE — Telephone Encounter (Signed)
New problem Pt was calling about her shoulder please call

## 2011-03-22 NOTE — Telephone Encounter (Signed)
Patient could not hear to call [facility] orthopedic, will call in am and set up appointment for her

## 2011-03-23 NOTE — Telephone Encounter (Signed)
Dr Rennis Chris 1/25 at 3:15 and will put on wait list.  They are unable to put her in with PA since it has been almost a year since she was seen. Advised patient of appointment date and time.

## 2011-04-05 ENCOUNTER — Encounter: Payer: Medicare Other | Admitting: *Deleted

## 2011-04-06 ENCOUNTER — Ambulatory Visit (INDEPENDENT_AMBULATORY_CARE_PROVIDER_SITE_OTHER): Payer: Medicare Other | Admitting: *Deleted

## 2011-04-06 DIAGNOSIS — Z79899 Other long term (current) drug therapy: Secondary | ICD-10-CM

## 2011-04-06 DIAGNOSIS — I4891 Unspecified atrial fibrillation: Secondary | ICD-10-CM

## 2011-04-06 LAB — POCT INR: INR: 1.6

## 2011-04-21 ENCOUNTER — Telehealth: Payer: Self-pay | Admitting: Cardiology

## 2011-04-21 NOTE — Telephone Encounter (Signed)
Ok to fly on airplane

## 2011-04-21 NOTE — Telephone Encounter (Signed)
Advised patient

## 2011-04-21 NOTE — Telephone Encounter (Signed)
New msg Pt wants to know if she can go on airplane. Please call her if ok

## 2011-04-24 ENCOUNTER — Other Ambulatory Visit: Payer: Self-pay | Admitting: *Deleted

## 2011-04-24 MED ORDER — WARFARIN SODIUM 5 MG PO TABS: 5.0000 mg | ORAL_TABLET | ORAL | Status: DC

## 2011-04-27 ENCOUNTER — Ambulatory Visit (INDEPENDENT_AMBULATORY_CARE_PROVIDER_SITE_OTHER): Payer: Medicare Other | Admitting: *Deleted

## 2011-04-27 ENCOUNTER — Encounter: Payer: Self-pay | Admitting: Cardiology

## 2011-04-27 ENCOUNTER — Ambulatory Visit (INDEPENDENT_AMBULATORY_CARE_PROVIDER_SITE_OTHER): Payer: Medicare Other | Admitting: Cardiology

## 2011-04-27 VITALS — BP 120/64 | HR 78 | Ht 62.0 in | Wt 106.0 lb

## 2011-04-27 DIAGNOSIS — E039 Hypothyroidism, unspecified: Secondary | ICD-10-CM

## 2011-04-27 DIAGNOSIS — I4891 Unspecified atrial fibrillation: Secondary | ICD-10-CM

## 2011-04-27 DIAGNOSIS — Z79899 Other long term (current) drug therapy: Secondary | ICD-10-CM

## 2011-04-27 DIAGNOSIS — R634 Abnormal weight loss: Secondary | ICD-10-CM

## 2011-04-27 LAB — POCT INR: INR: 2.4

## 2011-04-27 NOTE — Assessment & Plan Note (Signed)
No recent symptoms of congestive heart failure.  No TIA symptoms.  She remains quite alert and active for her age 78 and lives independently

## 2011-04-27 NOTE — Patient Instructions (Addendum)
Your physician recommends that you continue on your current medications as directed. Please refer to the Current Medication list given to you today. Your physician recommends that you schedule a follow-up appointment in: 3 months Get your coumadin check today

## 2011-04-27 NOTE — Assessment & Plan Note (Signed)
The patient had been concerned about unintentional weight loss.  Since last visit she has stabilized and actually has gained 3 pounds.  She has had a good appetite.

## 2011-04-27 NOTE — Assessment & Plan Note (Signed)
The patient remains clinically euthyroid on Synthroid supplementation

## 2011-04-27 NOTE — Progress Notes (Signed)
[name] Date of Birth:  1914-06-09 [facility] [address] [place], [place]  [address] [phone]  Fax   [phone]  HPI: This pleasant 78 year old woman is seen for a three-month followup office visit.  She has a history of established atrial fibrillation.  She has a history of tachybradycardia syndrome.  She does not do well when she is in normal sinus rhythm because of extreme bradycardia.  She has done well with rate control and long-term Coumadin.  She has not had any TIA symptoms.  She does not have any history of ischemic heart disease and she had a normal adenosine Cardiolite stress test on 12/27/06.  Her ejection fraction was 73%.  Her echocardiogram in 01/29/08 showed an ejection fraction of 60-65% with biatrial enlargement, mild aortic stenosis and mild aortic insufficiency and moderate pulmonary hypertension with moderate mitral regurgitation.  She does have a history of mild congestive heart failure controlled on low-dose Lasix.  Current Outpatient Prescriptions  Medication Sig Dispense Refill  . aspirin 81 MG tablet Take 81 mg by mouth daily.        . chlordiazePOXIDE (LIBRIUM) 5 MG capsule Take 1 capsule (5 mg total) by mouth daily as needed. For nerves  30 capsule  5  . digoxin (LANOXIN) 0.125 MG tablet Take 1 tablet (125 mcg total) by mouth daily.  30 tablet  11  . diltiazem (CARDIZEM CD) 300 MG 24 hr capsule Take 1 capsule (300 mg total) by mouth daily.  30 capsule  11  . furosemide (LASIX) 40 MG tablet Take 1 tablet (40 mg total) by mouth daily.  30 tablet  5  . isosorbide mononitrate (IMDUR) 60 MG 24 hr tablet Take 60 mg by mouth daily.        Marland Kitchen levothyroxine (SYNTHROID, LEVOTHROID) 100 MCG tablet Take 1 tablet (100 mcg total) by mouth daily.  31 tablet  11  . metoprolol (TOPROL-XL) 100 MG 24 hr tablet Take 0.5 tablets (50 mg total) by mouth 2 (two) times daily.  30 tablet  6  . nitroGLYCERIN (NITROSTAT) 0.4 MG SL tablet Place 0.4 mg under  the tongue every 5 (five) minutes as needed. For chest pains       . potassium chloride SA (K-DUR,KLOR-CON) 20 MEQ tablet Take 20 mEq by mouth every other day.        . warfarin (COUMADIN) 5 MG tablet Take 1 tablet (5 mg total) by mouth as directed. Take as directed by coumadin clinic  30 tablet  3    Allergies  Allergen Reactions  . Penicillins   . Pepcid   . Sulfa Antibiotics   . Tetracyclines & Related     Patient Active Problem List  Diagnoses  . Atrial fibrillation with controlled ventricular response  . Hypothyroidism  . Hypertension  . Weight loss, non-intentional    History  Smoking status  . Never Smoker   Smokeless tobacco  . Not on file    History  Alcohol Use     No family history on file.  Review of Systems: The patient denies any heat or cold intolerance.  No weight gain or weight loss.  The patient denies headaches or blurry vision.  There is no cough or sputum production.  The patient denies dizziness.  There is no hematuria or hematochezia.  The patient denies any muscle aches or arthritis.  The patient denies any rash.  The patient denies frequent falling or instability.  There is no history of depression  or anxiety.  All other systems were reviewed and are negative.   Physical Exam: Filed Vitals:   04/27/11 1125  BP: 120/64  Pulse: 78   on examination this is a well-developed well-nourished elderly woman in no distress.  Head and neck exam reveals that she has some new hearing aids.  The jugular venous pressure is normal.  No carotid bruits.  Thyroid not enlarged.  Pupils are equal and reactive extraocular movements are full. The chest is clear to percussion and auscultation. There are no rales or rhonchi. Expansion of the chest is symmetrical.  Heart reveals an irregular rhythm and a soft apical systolic murmur. The abdomen is soft and nontender. Bowel sounds are normal. The liver and spleen are not enlarged. There Are no abdominal masses. There are  no bruits.  The pedal pulses are good.  There is no phlebitis or edema.  There is no cyanosis or clubbing.  She does have chronic lymphedema of the right upper arm following prior right mastectomy. Strength is normal and symmetrical in all extremities.  There is no lateralizing weakness.  There are no sensory deficits.  The skin is warm and dry.  There is no rash.      Assessment / Plan: Continue same medication.  She is planning another beach trip in May to [facility] and she should be fine to do that.  We will see her here in 3 months for a followup visit.  Continue regular visits to the Coumadin clinic.

## 2011-05-01 ENCOUNTER — Other Ambulatory Visit: Payer: Self-pay | Admitting: *Deleted

## 2011-05-01 MED ORDER — POTASSIUM CHLORIDE CRYS ER 20 MEQ PO TBCR: 20.0000 meq | EXTENDED_RELEASE_TABLET | ORAL | Status: DC

## 2011-05-01 NOTE — Telephone Encounter (Signed)
Refilled potassium

## 2011-05-15 ENCOUNTER — Ambulatory Visit (INDEPENDENT_AMBULATORY_CARE_PROVIDER_SITE_OTHER): Payer: Medicare Other

## 2011-05-15 DIAGNOSIS — I4891 Unspecified atrial fibrillation: Secondary | ICD-10-CM

## 2011-05-15 DIAGNOSIS — Z79899 Other long term (current) drug therapy: Secondary | ICD-10-CM

## 2011-05-22 ENCOUNTER — Other Ambulatory Visit: Payer: Self-pay

## 2011-05-22 MED ORDER — ISOSORBIDE MONONITRATE ER 60 MG PO TB24: 60.0000 mg | ORAL_TABLET | Freq: Every day | ORAL | Status: DC

## 2011-05-22 NOTE — Telephone Encounter (Signed)
..   Requested Prescriptions   Signed Prescriptions Disp Refills  . isosorbide mononitrate (IMDUR) 60 MG 24 hr tablet 30 tablet 11    Sig: Take 1 tablet (60 mg total) by mouth daily.    Authorizing Provider: Cassell Clement    Ordering User: Christella Hartigan, Emilija Bohman Judie Petit

## 2011-06-07 ENCOUNTER — Ambulatory Visit (INDEPENDENT_AMBULATORY_CARE_PROVIDER_SITE_OTHER): Payer: Medicare Other | Admitting: *Deleted

## 2011-06-07 DIAGNOSIS — Z79899 Other long term (current) drug therapy: Secondary | ICD-10-CM

## 2011-06-07 DIAGNOSIS — I4891 Unspecified atrial fibrillation: Secondary | ICD-10-CM

## 2011-06-07 LAB — POCT INR: INR: 2.8

## 2011-06-26 ENCOUNTER — Other Ambulatory Visit: Payer: Self-pay | Admitting: *Deleted

## 2011-06-26 DIAGNOSIS — E039 Hypothyroidism, unspecified: Secondary | ICD-10-CM

## 2011-06-26 DIAGNOSIS — F419 Anxiety disorder, unspecified: Secondary | ICD-10-CM

## 2011-06-26 MED ORDER — LEVOTHYROXINE SODIUM 100 MCG PO TABS: 100.0000 ug | ORAL_TABLET | Freq: Every day | ORAL | Status: DC

## 2011-06-26 MED ORDER — CHLORDIAZEPOXIDE HCL 5 MG PO CAPS: 5.0000 mg | ORAL_CAPSULE | Freq: Every day | ORAL | Status: DC | PRN

## 2011-06-26 NOTE — Telephone Encounter (Signed)
Refilled levothyroxine.

## 2011-06-26 NOTE — Telephone Encounter (Signed)
Refill on generic librium

## 2011-07-05 ENCOUNTER — Ambulatory Visit (INDEPENDENT_AMBULATORY_CARE_PROVIDER_SITE_OTHER): Payer: Medicare Other | Admitting: Pharmacist

## 2011-07-05 DIAGNOSIS — I4891 Unspecified atrial fibrillation: Secondary | ICD-10-CM

## 2011-07-05 DIAGNOSIS — Z79899 Other long term (current) drug therapy: Secondary | ICD-10-CM

## 2011-07-26 ENCOUNTER — Encounter: Payer: Self-pay | Admitting: Cardiology

## 2011-07-26 ENCOUNTER — Ambulatory Visit (INDEPENDENT_AMBULATORY_CARE_PROVIDER_SITE_OTHER): Payer: Medicare Other | Admitting: *Deleted

## 2011-07-26 ENCOUNTER — Ambulatory Visit (INDEPENDENT_AMBULATORY_CARE_PROVIDER_SITE_OTHER): Payer: Medicare Other | Admitting: Cardiology

## 2011-07-26 VITALS — BP 110/68 | HR 80 | Ht 63.0 in | Wt 106.0 lb

## 2011-07-26 DIAGNOSIS — E039 Hypothyroidism, unspecified: Secondary | ICD-10-CM

## 2011-07-26 DIAGNOSIS — I4891 Unspecified atrial fibrillation: Secondary | ICD-10-CM

## 2011-07-26 DIAGNOSIS — R634 Abnormal weight loss: Secondary | ICD-10-CM

## 2011-07-26 DIAGNOSIS — I119 Hypertensive heart disease without heart failure: Secondary | ICD-10-CM

## 2011-07-26 DIAGNOSIS — Z79899 Other long term (current) drug therapy: Secondary | ICD-10-CM

## 2011-07-26 DIAGNOSIS — I10 Essential (primary) hypertension: Secondary | ICD-10-CM

## 2011-07-26 NOTE — Assessment & Plan Note (Signed)
Her weight has stabilized at 106 which is unchanged from last visit.  Her appetite is satisfactory.  She remains quite physically active and alert

## 2011-07-26 NOTE — Patient Instructions (Signed)
Your physician recommends that you continue on your current medications as directed. Please refer to the Current Medication list given to you today.  Your physician recommends that you schedule a follow-up appointment in: 3 month ov/ekg 

## 2011-07-26 NOTE — Assessment & Plan Note (Signed)
She's not having any symptoms referable to her essential hypertension.  She's had no chest pain.  She does not have to take any sublingual nitroglycerin.

## 2011-07-26 NOTE — Progress Notes (Signed)
[name] Date of Birth:  [birth year] [facility] [address] [place], [place]  [address] [phone]  Fax   [phone]  HPI: This pleasant 78 year old woman is seen for a scheduled followup office visit.  She has a history of established atrial fibrillation and a past history of tachybradycardia syndrome.  She does not have a pacemaker.  She does better when she is in atrial fibrillation then when she is in sinus rhythm.  She is on long-term Coumadin.  She does not have any history of ischemic heart disease and she had a normal adenosine Cardiolite stress test in 2008.  Her echocardiogram in 2009 showed an ejection fraction of 60-65% with mild aortic stenosis and with moderate pulmonary hypertension and moderate mitral regurgitation.  Current Outpatient Prescriptions  Medication Sig Dispense Refill  . aspirin 81 MG tablet Take 81 mg by mouth daily.        . chlordiazePOXIDE (LIBRIUM) 5 MG capsule Take 1 capsule (5 mg total) by mouth daily as needed. For nerves  30 capsule  5  . digoxin (LANOXIN) 0.125 MG tablet Take 1 tablet (125 mcg total) by mouth daily.  30 tablet  11  . diltiazem (CARDIZEM CD) 300 MG 24 hr capsule Take 1 capsule (300 mg total) by mouth daily.  30 capsule  11  . furosemide (LASIX) 40 MG tablet Take 1 tablet (40 mg total) by mouth daily.  30 tablet  5  . isosorbide mononitrate (IMDUR) 60 MG 24 hr tablet Take 1 tablet (60 mg total) by mouth daily.  30 tablet  11  . levothyroxine (SYNTHROID, LEVOTHROID) 100 MCG tablet Take 1 tablet (100 mcg total) by mouth daily.  31 tablet  11  . metoprolol (TOPROL-XL) 100 MG 24 hr tablet Take 0.5 tablets (50 mg total) by mouth 2 (two) times daily.  30 tablet  6  . nitroGLYCERIN (NITROSTAT) 0.4 MG SL tablet Place 0.4 mg under the tongue every 5 (five) minutes as needed. For chest pains       . potassium chloride SA (K-DUR,KLOR-CON) 20 MEQ tablet Take 1 tablet (20 mEq total) by mouth every other day.  90 tablet   3  . warfarin (COUMADIN) 5 MG tablet Take 1 tablet (5 mg total) by mouth as directed. Take as directed by coumadin clinic  30 tablet  3    Allergies  Allergen Reactions  . Famotidine   . Penicillins   . Sulfa Antibiotics   . Tetracyclines & Related     Patient Active Problem List  Diagnoses  . Atrial fibrillation with controlled ventricular response  . Hypothyroidism  . Hypertension  . Weight loss, non-intentional    History  Smoking status  . Never Smoker   Smokeless tobacco  . Not on file    History  Alcohol Use     No family history on file.  Review of Systems: The patient denies any heat or cold intolerance.  No weight gain or weight loss.  The patient denies headaches or blurry vision.  There is no cough or sputum production.  The patient denies dizziness.  There is no hematuria or hematochezia.  The patient denies any muscle aches or arthritis.  The patient denies any rash.  The patient denies frequent falling or instability.  There is no history of depression or anxiety.  All other systems were reviewed and are negative.   Physical Exam: Filed Vitals:   07/26/11 1053  BP: 110/68  Pulse: 80  the general appearance reveals a alert elderly woman in no distress.The head and neck exam reveals pupils equal and reactive.  Extraocular movements are full.  There is no scleral icterus.  The mouth and pharynx are normal.  The neck is supple.  The carotids reveal no bruits.  The jugular venous pressure is normal.  The  thyroid is not enlarged.  There is no lymphadenopathy.  The chest is clear to percussion and auscultation.  There are no rales or rhonchi.  Expansion of the chest is symmetrical.  The precordium is quiet.  The first heart sound is normal.  The second heart sound is physiologically split.  There is no murmur gallop rub or click.  There is no abnormal lift or heave.  The abdomen is soft and nontender.  The bowel sounds are normal.  The liver and spleen are not  enlarged.  There are no abdominal masses.  There are no abdominal bruits.  Extremities reveal good pedal pulses.  She does have chronic lymphedema of the right arm secondary to previous right radical mastectomy years ago. There is no phlebitis or edema.  There is no cyanosis or clubbing.  Strength is normal and symmetrical in all extremities.  There is no lateralizing weakness.  There are no sensory deficits.  The skin is warm and dry.  There is no rash.      Assessment / Plan: The patient is to continue on same medication and be rechecked in 3 months for followup office visit and EKG

## 2011-07-26 NOTE — Assessment & Plan Note (Signed)
The patient has occasional sensation that her heart is racing.  Generally she has had very few palpitations

## 2011-08-01 ENCOUNTER — Other Ambulatory Visit: Payer: Self-pay | Admitting: *Deleted

## 2011-08-01 MED ORDER — FUROSEMIDE 40 MG PO TABS: 40.0000 mg | ORAL_TABLET | Freq: Every day | ORAL | Status: DC

## 2011-08-09 ENCOUNTER — Ambulatory Visit (INDEPENDENT_AMBULATORY_CARE_PROVIDER_SITE_OTHER): Payer: Medicare Other | Admitting: *Deleted

## 2011-08-09 DIAGNOSIS — Z79899 Other long term (current) drug therapy: Secondary | ICD-10-CM

## 2011-08-09 DIAGNOSIS — I4891 Unspecified atrial fibrillation: Secondary | ICD-10-CM

## 2011-08-30 ENCOUNTER — Ambulatory Visit (INDEPENDENT_AMBULATORY_CARE_PROVIDER_SITE_OTHER): Payer: Medicare Other | Admitting: *Deleted

## 2011-08-30 DIAGNOSIS — I4891 Unspecified atrial fibrillation: Secondary | ICD-10-CM

## 2011-08-30 DIAGNOSIS — Z79899 Other long term (current) drug therapy: Secondary | ICD-10-CM

## 2011-09-11 ENCOUNTER — Other Ambulatory Visit: Payer: Self-pay | Admitting: Cardiology

## 2011-09-11 MED ORDER — DILTIAZEM HCL ER COATED BEADS 300 MG PO CP24: 300.0000 mg | ORAL_CAPSULE | Freq: Every day | ORAL | Status: DC

## 2011-09-11 MED ORDER — DIGOXIN 125 MCG PO TABS: 125.0000 ug | ORAL_TABLET | Freq: Every day | ORAL | Status: DC

## 2011-09-18 ENCOUNTER — Ambulatory Visit (INDEPENDENT_AMBULATORY_CARE_PROVIDER_SITE_OTHER): Payer: Medicare Other

## 2011-09-18 DIAGNOSIS — Z79899 Other long term (current) drug therapy: Secondary | ICD-10-CM

## 2011-09-18 DIAGNOSIS — I4891 Unspecified atrial fibrillation: Secondary | ICD-10-CM

## 2011-09-26 ENCOUNTER — Other Ambulatory Visit: Payer: Self-pay

## 2011-09-26 ENCOUNTER — Other Ambulatory Visit: Payer: Self-pay | Admitting: Cardiology

## 2011-09-26 MED ORDER — METOPROLOL SUCCINATE ER 100 MG PO TB24: ORAL_TABLET | ORAL | Status: DC

## 2011-09-26 NOTE — Telephone Encounter (Signed)
Error

## 2011-10-09 ENCOUNTER — Ambulatory Visit (INDEPENDENT_AMBULATORY_CARE_PROVIDER_SITE_OTHER): Payer: Medicare Other

## 2011-10-09 DIAGNOSIS — Z79899 Other long term (current) drug therapy: Secondary | ICD-10-CM

## 2011-10-09 DIAGNOSIS — I4891 Unspecified atrial fibrillation: Secondary | ICD-10-CM

## 2011-10-23 ENCOUNTER — Ambulatory Visit (INDEPENDENT_AMBULATORY_CARE_PROVIDER_SITE_OTHER): Payer: Medicare Other | Admitting: Pharmacist

## 2011-10-23 DIAGNOSIS — I4891 Unspecified atrial fibrillation: Secondary | ICD-10-CM

## 2011-10-23 DIAGNOSIS — Z79899 Other long term (current) drug therapy: Secondary | ICD-10-CM

## 2011-10-26 ENCOUNTER — Ambulatory Visit (INDEPENDENT_AMBULATORY_CARE_PROVIDER_SITE_OTHER): Payer: Medicare Other | Admitting: Cardiology

## 2011-10-26 ENCOUNTER — Encounter: Payer: Self-pay | Admitting: Cardiology

## 2011-10-26 VITALS — BP 110/70 | HR 70 | Ht 63.0 in | Wt 103.0 lb

## 2011-10-26 DIAGNOSIS — I10 Essential (primary) hypertension: Secondary | ICD-10-CM

## 2011-10-26 DIAGNOSIS — I4891 Unspecified atrial fibrillation: Secondary | ICD-10-CM

## 2011-10-26 DIAGNOSIS — B351 Tinea unguium: Secondary | ICD-10-CM

## 2011-10-26 NOTE — Patient Instructions (Addendum)
You have an appt with Dr Harriet Pho on 11/01/11 at 10:40 am.  Please arrive 15 minutes early to fill out paperwork.  Her address is [address]  [place], [place] [address]   Phone: [phone]  Your physician recommends that you schedule a follow-up appointment in: 3 months

## 2011-10-26 NOTE — Assessment & Plan Note (Signed)
Patient has a past history of essential hypertension.  She has not been having any dizzy spells or headaches.  Pressure has been remaining in satisfactory range on current therapy.

## 2011-10-26 NOTE — Assessment & Plan Note (Signed)
The patient has a fungal infection of the right great toe consistent with onychomycosis.  It is painful for her to try to wear most of her shoes.  She has had podiatry care in the past and we are going to refer her to Dr. Harriet Pho for evaluation and treatment of the onychomycosis.  The patient had previously tried some home remedies such as applying Vicks vapor rub to the toenail with no improvement

## 2011-10-26 NOTE — Assessment & Plan Note (Signed)
The patient has had no symptoms referable to her atrial fibrillation.  She denies any TIA symptoms.  She is on long-term Coumadin.

## 2011-10-26 NOTE — Progress Notes (Signed)
[name] Date of Birth:  Nov 22, 1914 [facility] [address] [place], [place]  [address] [phone]  Fax   [phone]  HPI: This pleasant 78 year old woman is seen for a scheduled 3 month followup office visit.  She has a history of atrial fibrillation with controlled ventricular response.  He also has a history of essential hypertension.  She has had some unintentional weight loss.  He does not have any history of ischemic heart disease and she had a normal adenosine Cardiolite stress test in 2008.  She had an echocardiogram in 2009 showing an ejection fraction of 60-65% with mild aortic stenosis and moderate pulmonary hypertension and moderate mitral regurgitation. Current Outpatient Prescriptions  Medication Sig Dispense Refill  . aspirin 81 MG tablet Take 81 mg by mouth daily.        . chlordiazePOXIDE (LIBRIUM) 5 MG capsule Take 1 capsule (5 mg total) by mouth daily as needed. For nerves  30 capsule  5  . digoxin (LANOXIN) 0.125 MG tablet Take 1 tablet (125 mcg total) by mouth daily.  30 tablet  11  . diltiazem (CARDIZEM CD) 300 MG 24 hr capsule Take 1 capsule (300 mg total) by mouth daily.  30 capsule  11  . furosemide (LASIX) 40 MG tablet Take 1 tablet (40 mg total) by mouth daily.  30 tablet  8  . isosorbide mononitrate (IMDUR) 60 MG 24 hr tablet Take 1 tablet (60 mg total) by mouth daily.  30 tablet  11  . levothyroxine (SYNTHROID, LEVOTHROID) 100 MCG tablet Take 1 tablet (100 mcg total) by mouth daily.  31 tablet  11  . metoprolol succinate (TOPROL-XL) 100 MG 24 hr tablet Take by mouth 1/2 tablet twice daily  30 tablet  6  . nitroGLYCERIN (NITROSTAT) 0.4 MG SL tablet Place 0.4 mg under the tongue every 5 (five) minutes as needed. For chest pains       . potassium chloride SA (K-DUR,KLOR-CON) 20 MEQ tablet Take 1 tablet (20 mEq total) by mouth every other day.  90 tablet  3  . warfarin (COUMADIN) 5 MG tablet Take 1 tablet (5 mg total) by mouth as  directed. Take as directed by coumadin clinic  30 tablet  3  . dorzolamide (TRUSOPT) 2 % ophthalmic solution as directed.      . latanoprost (XALATAN) 0.005 % ophthalmic solution as directed.        Allergies  Allergen Reactions  . Famotidine   . Penicillins   . Sulfa Antibiotics   . Tetracyclines & Related     Patient Active Problem List  Diagnosis  . Atrial fibrillation with controlled ventricular response  . Hypothyroidism  . Hypertension  . Weight loss, non-intentional    History  Smoking status  . Never Smoker   Smokeless tobacco  . Not on file    History  Alcohol Use     No family history on file.  Review of Systems: The patient denies any heat or cold intolerance.  No weight gain or weight loss.  The patient denies headaches or blurry vision.  There is no cough or sputum production.  The patient denies dizziness.  There is no hematuria or hematochezia.  The patient denies any muscle aches or arthritis.  The patient denies any rash.  The patient denies frequent falling or instability.  There is no history of depression or anxiety.  All other systems were reviewed and are negative.   Physical Exam: Filed Vitals:   10/26/11  1106  BP: 110/70  Pulse: 70   The general appearance reveals an alert elderly woman in no distress.Pupils equal and reactive.   Extraocular Movements are full.  There is no scleral icterus.  The mouth and pharynx are normal.  The neck is supple.  The carotids reveal no bruits.  The jugular venous pressure is normal.  The thyroid is not enlarged.  There is no lymphadenopathy.  The chest is clear to percussion and auscultation. There are no rales or rhonchi. Expansion of the chest is symmetrical.  Heart reveals a soft apical systolic murmurThe abdomen is soft and nontender. Bowel sounds are normal. The liver and spleen are not enlarged. There Are no abdominal masses. There are no bruits.  Extremities show that she has chronic lymphedema of the  right arm secondary to previous right mastectomy for cancer years ago.  She also has a onychomycosis of the right great toe.  She has good pedal pulses    Assessment / Plan: The patient is to continue same medication.  We are referring her to podiatry for her toe.  Recheck in 3 months

## 2011-11-07 ENCOUNTER — Other Ambulatory Visit: Payer: Self-pay | Admitting: *Deleted

## 2011-11-07 MED ORDER — WARFARIN SODIUM 5 MG PO TABS: ORAL_TABLET | ORAL | Status: DC

## 2011-11-14 ENCOUNTER — Ambulatory Visit (INDEPENDENT_AMBULATORY_CARE_PROVIDER_SITE_OTHER): Payer: Medicare Other | Admitting: Pharmacist

## 2011-11-14 DIAGNOSIS — I4891 Unspecified atrial fibrillation: Secondary | ICD-10-CM

## 2011-11-14 DIAGNOSIS — Z79899 Other long term (current) drug therapy: Secondary | ICD-10-CM

## 2011-12-13 ENCOUNTER — Ambulatory Visit (INDEPENDENT_AMBULATORY_CARE_PROVIDER_SITE_OTHER): Payer: Medicare Other | Admitting: Pharmacist

## 2011-12-13 DIAGNOSIS — I4891 Unspecified atrial fibrillation: Secondary | ICD-10-CM

## 2011-12-13 DIAGNOSIS — Z79899 Other long term (current) drug therapy: Secondary | ICD-10-CM

## 2011-12-13 LAB — POCT INR: INR: 3.3

## 2011-12-29 ENCOUNTER — Ambulatory Visit (INDEPENDENT_AMBULATORY_CARE_PROVIDER_SITE_OTHER): Payer: Medicare Other | Admitting: *Deleted

## 2011-12-29 ENCOUNTER — Telehealth: Payer: Self-pay | Admitting: Cardiology

## 2011-12-29 DIAGNOSIS — I4891 Unspecified atrial fibrillation: Secondary | ICD-10-CM

## 2011-12-29 NOTE — Telephone Encounter (Signed)
Patient complaining of sore throat and just not feeling good.  Did have the flu shot at PCP earlier this week, advised to contact them.

## 2011-12-29 NOTE — Telephone Encounter (Signed)
plz return call to pt [phone], pt said she feels bad today did not give anymore details.

## 2011-12-29 NOTE — Patient Instructions (Signed)
Can take robitussin DM for cough

## 2011-12-30 ENCOUNTER — Emergency Department (HOSPITAL_COMMUNITY): Payer: Medicare Other

## 2011-12-30 ENCOUNTER — Inpatient Hospital Stay (HOSPITAL_COMMUNITY)
Admission: EM | Admit: 2011-12-30 | Discharge: 2012-01-02 | DRG: 194 | Disposition: A | Discharge status: Home Health | Payer: Medicare Other | Attending: Internal Medicine | Admitting: Internal Medicine

## 2011-12-30 ENCOUNTER — Encounter (HOSPITAL_COMMUNITY): Payer: Self-pay | Admitting: Emergency Medicine

## 2011-12-30 DIAGNOSIS — Z882 Allergy status to sulfonamides status: Secondary | ICD-10-CM

## 2011-12-30 DIAGNOSIS — J029 Acute pharyngitis, unspecified: Secondary | ICD-10-CM

## 2011-12-30 DIAGNOSIS — Z7901 Long term (current) use of anticoagulants: Secondary | ICD-10-CM

## 2011-12-30 DIAGNOSIS — I079 Rheumatic tricuspid valve disease, unspecified: Secondary | ICD-10-CM | POA: Diagnosis present

## 2011-12-30 DIAGNOSIS — I5032 Chronic diastolic (congestive) heart failure: Secondary | ICD-10-CM | POA: Diagnosis present

## 2011-12-30 DIAGNOSIS — Z66 Do not resuscitate: Secondary | ICD-10-CM | POA: Diagnosis present

## 2011-12-30 DIAGNOSIS — R634 Abnormal weight loss: Secondary | ICD-10-CM

## 2011-12-30 DIAGNOSIS — D72829 Elevated white blood cell count, unspecified: Secondary | ICD-10-CM

## 2011-12-30 DIAGNOSIS — R059 Cough, unspecified: Secondary | ICD-10-CM

## 2011-12-30 DIAGNOSIS — F419 Anxiety disorder, unspecified: Secondary | ICD-10-CM

## 2011-12-30 DIAGNOSIS — Z881 Allergy status to other antibiotic agents status: Secondary | ICD-10-CM

## 2011-12-30 DIAGNOSIS — I059 Rheumatic mitral valve disease, unspecified: Secondary | ICD-10-CM | POA: Diagnosis present

## 2011-12-30 DIAGNOSIS — Z9010 Acquired absence of unspecified breast and nipple: Secondary | ICD-10-CM

## 2011-12-30 DIAGNOSIS — B351 Tinea unguium: Secondary | ICD-10-CM

## 2011-12-30 DIAGNOSIS — E875 Hyperkalemia: Secondary | ICD-10-CM | POA: Diagnosis present

## 2011-12-30 DIAGNOSIS — Z853 Personal history of malignant neoplasm of breast: Secondary | ICD-10-CM

## 2011-12-30 DIAGNOSIS — Z880 Allergy status to penicillin: Secondary | ICD-10-CM

## 2011-12-30 DIAGNOSIS — R05 Cough: Secondary | ICD-10-CM

## 2011-12-30 DIAGNOSIS — I379 Nonrheumatic pulmonary valve disorder, unspecified: Secondary | ICD-10-CM | POA: Diagnosis present

## 2011-12-30 DIAGNOSIS — Z7982 Long term (current) use of aspirin: Secondary | ICD-10-CM

## 2011-12-30 DIAGNOSIS — E039 Hypothyroidism, unspecified: Secondary | ICD-10-CM

## 2011-12-30 DIAGNOSIS — I4891 Unspecified atrial fibrillation: Secondary | ICD-10-CM

## 2011-12-30 DIAGNOSIS — J189 Pneumonia, unspecified organism: Principal | ICD-10-CM

## 2011-12-30 DIAGNOSIS — I10 Essential (primary) hypertension: Secondary | ICD-10-CM

## 2011-12-30 DIAGNOSIS — Z79899 Other long term (current) drug therapy: Secondary | ICD-10-CM

## 2011-12-30 DIAGNOSIS — I509 Heart failure, unspecified: Secondary | ICD-10-CM | POA: Diagnosis present

## 2011-12-30 LAB — BASIC METABOLIC PANEL
GFR calc Af Amer: 66 mL/min — ABNORMAL LOW (ref >90)
GFR calc non Af Amer: 57 mL/min — ABNORMAL LOW (ref >90)
Potassium: 3.7 mEq/L (ref 3.5–5.1)
Sodium: 139 mEq/L (ref 135–145)

## 2011-12-30 LAB — PROTIME-INR
INR: 2.84 — ABNORMAL HIGH (ref 0.00–1.49)
Prothrombin Time: 28.4 seconds — ABNORMAL HIGH (ref 11.6–15.2)

## 2011-12-30 LAB — DIGOXIN LEVEL: Digoxin Level: 0.7 ng/mL — ABNORMAL LOW (ref 0.8–2.0)

## 2011-12-30 LAB — GLUCOSE, CAPILLARY: Glucose-Capillary: 130 mg/dL — ABNORMAL HIGH (ref 70–99)

## 2011-12-30 LAB — CBC
HCT: 37.8 % (ref 36.0–46.0)
MCH: 29.1 pg (ref 26.0–34.0)
MCV: 86.5 fL (ref 78.0–100.0)
Platelets: 252 10*3/uL (ref 150–400)
RBC: 4.37 MIL/uL (ref 3.87–5.11)

## 2011-12-30 LAB — MRSA PCR SCREENING: MRSA by PCR: POSITIVE — AB

## 2011-12-30 MED ORDER — GUAIFENESIN-DM 100-10 MG/5ML PO SYRP
5.0000 mL | ORAL_SOLUTION | ORAL | Status: DC | PRN
  Administered 2011-12-31 – 2012-01-01 (×4): 5 mL via ORAL
  Filled 2011-12-30 (×5): qty 5

## 2011-12-30 MED ORDER — ASPIRIN 81 MG PO CHEW
81.0000 mg | CHEWABLE_TABLET | Freq: Every day | ORAL | Status: DC
  Administered 2011-12-30 – 2012-01-01 (×3): 81 mg via ORAL
  Filled 2011-12-30 (×3): qty 1

## 2011-12-30 MED ORDER — ASPIRIN 81 MG PO TABS: 81.0000 mg | ORAL_TABLET | Freq: Every day | ORAL | Status: DC

## 2011-12-30 MED ORDER — DILTIAZEM HCL ER COATED BEADS 300 MG PO CP24
300.0000 mg | ORAL_CAPSULE | Freq: Every day | ORAL | Status: DC
  Administered 2011-12-30 – 2012-01-02 (×4): 300 mg via ORAL
  Filled 2011-12-30 (×4): qty 1

## 2011-12-30 MED ORDER — PHENOL 1.4 % MT LIQD
1.0000 | OROMUCOSAL | Status: DC | PRN
  Filled 2011-12-30: qty 177

## 2011-12-30 MED ORDER — WARFARIN - PHARMACIST DOSING INPATIENT
Freq: Every day | Status: DC
  Administered 2012-01-01: 18:00:00

## 2011-12-30 MED ORDER — WARFARIN SODIUM 5 MG PO TABS
5.0000 mg | ORAL_TABLET | ORAL | Status: DC
  Administered 2011-12-30: 5 mg via ORAL
  Filled 2011-12-30: qty 1

## 2011-12-30 MED ORDER — ISOSORBIDE MONONITRATE ER 60 MG PO TB24
60.0000 mg | ORAL_TABLET | Freq: Every day | ORAL | Status: DC
  Administered 2011-12-30 – 2012-01-02 (×4): 60 mg via ORAL
  Filled 2011-12-30 (×4): qty 1

## 2011-12-30 MED ORDER — ASPIRIN 81 MG PO CHEW: 81.0000 mg | CHEWABLE_TABLET | Freq: Every day | ORAL | Status: DC

## 2011-12-30 MED ORDER — LEVOTHYROXINE SODIUM 100 MCG PO TABS
100.0000 ug | ORAL_TABLET | Freq: Every day | ORAL | Status: DC
  Administered 2011-12-30 – 2012-01-02 (×4): 100 ug via ORAL
  Filled 2011-12-30 (×4): qty 1

## 2011-12-30 MED ORDER — DORZOLAMIDE HCL 2 % OP SOLN
1.0000 [drp] | Freq: Two times a day (BID) | OPHTHALMIC | Status: DC
  Administered 2011-12-30 – 2012-01-02 (×6): 1 [drp] via OPHTHALMIC
  Filled 2011-12-30: qty 10

## 2011-12-30 MED ORDER — SODIUM CHLORIDE 0.9 % IV SOLN
INTRAVENOUS | Status: DC
  Administered 2011-12-30: 16:00:00 via INTRAVENOUS

## 2011-12-30 MED ORDER — LEVOFLOXACIN IN D5W 750 MG/150ML IV SOLN
750.0000 mg | INTRAVENOUS | Status: DC
  Administered 2012-01-01: 750 mg via INTRAVENOUS
  Filled 2011-12-30: qty 150

## 2011-12-30 MED ORDER — MUPIROCIN 2 % EX OINT
1.0000 "application " | TOPICAL_OINTMENT | Freq: Two times a day (BID) | CUTANEOUS | Status: DC
  Administered 2011-12-30 – 2012-01-01 (×5): 1 via NASAL
  Filled 2011-12-30: qty 22

## 2011-12-30 MED ORDER — LATANOPROST 0.005 % OP SOLN
1.0000 [drp] | Freq: Every day | OPHTHALMIC | Status: DC
  Administered 2011-12-30 – 2012-01-01 (×3): 1 [drp] via OPHTHALMIC
  Filled 2011-12-30: qty 2.5

## 2011-12-30 MED ORDER — NITROGLYCERIN 0.4 MG SL SUBL: 0.4000 mg | SUBLINGUAL_TABLET | SUBLINGUAL | Status: DC | PRN

## 2011-12-30 MED ORDER — LEVOFLOXACIN IN D5W 750 MG/150ML IV SOLN
750.0000 mg | Freq: Once | INTRAVENOUS | Status: AC
  Administered 2011-12-30: 750 mg via INTRAVENOUS
  Filled 2011-12-30: qty 150

## 2011-12-30 MED ORDER — DIGOXIN 125 MCG PO TABS
125.0000 ug | ORAL_TABLET | Freq: Every day | ORAL | Status: DC
  Administered 2011-12-30 – 2012-01-02 (×4): 125 ug via ORAL
  Filled 2011-12-30 (×4): qty 1

## 2011-12-30 MED ORDER — ALBUTEROL SULFATE (5 MG/ML) 0.5% IN NEBU: 2.5000 mg | INHALATION_SOLUTION | RESPIRATORY_TRACT | Status: DC | PRN

## 2011-12-30 MED ORDER — POTASSIUM CHLORIDE CRYS ER 20 MEQ PO TBCR
20.0000 meq | EXTENDED_RELEASE_TABLET | ORAL | Status: DC
  Administered 2011-12-30 – 2012-01-01 (×2): 20 meq via ORAL
  Filled 2011-12-30 (×2): qty 1

## 2011-12-30 MED ORDER — METOPROLOL SUCCINATE ER 50 MG PO TB24
50.0000 mg | ORAL_TABLET | Freq: Two times a day (BID) | ORAL | Status: DC
  Administered 2011-12-30 – 2012-01-02 (×7): 50 mg via ORAL
  Filled 2011-12-30 (×9): qty 1

## 2011-12-30 MED ORDER — CHLORHEXIDINE GLUCONATE CLOTH 2 % EX PADS
6.0000 | MEDICATED_PAD | Freq: Every day | CUTANEOUS | Status: DC
  Administered 2011-12-31 – 2012-01-02 (×3): 6 via TOPICAL

## 2011-12-30 NOTE — ED Provider Notes (Signed)
History     CSN: [id number]  Arrival date & time 12/30/11  1191   First MD Initiated Contact with Patient 12/30/11 [phone]      Chief Complaint  Patient presents with  . Weakness  . Sore Throat  . Cough     Patient is a 78 y.o. female presenting with cough. The history is provided by the patient.  Cough This is a new problem. Episode onset: several days ago. The problem occurs hourly. The problem has been gradually worsening. The cough is non-productive. Associated symptoms include chills, sore throat and shortness of breath. Treatments tried: rest. The treatment provided no relief.  pt presents from home with cough, fatigue, shortness of breath and sore throat She reports feeling palpitations from afib No abd pain No vomiting No hemoptysis   Past Medical History  Diagnosis Date  . Atrial fibrillation with controlled ventricular response   . Hypothyroidism   . Hypertension   . Diastolic CHF, acute on chronic   . Valvular heart disease     with severe mr,tr,and mod pulmonary regurgitation  . Breast cancer     Past Surgical History  Procedure Date  . Mastectomy   . Oopherectomy   . Appendectomy   . Cholecystectomy     History reviewed. No pertinent family history.  History  Substance Use Topics  . Smoking status: Never Smoker   . Smokeless tobacco: Not on file  . Alcohol Use:     OB History    Grav Para Term Preterm Abortions TAB SAB Ect Mult Living                  Review of Systems  Constitutional: Positive for chills.  HENT: Positive for sore throat.   Respiratory: Positive for cough and shortness of breath.   All other systems reviewed and are negative.    Allergies  Famotidine; Penicillins; Sulfa antibiotics; and Tetracyclines & related  Home Medications   Current Outpatient Rx  Name Route Sig Dispense Refill  . ASPIRIN 81 MG PO TABS Oral Take 81 mg by mouth daily.      . CHLORDIAZEPOXIDE HCL 5 MG PO CAPS Oral Take 1 capsule (5 mg total) by  mouth daily as needed. For nerves 30 capsule 5  . DIGOXIN 0.125 MG PO TABS Oral Take 1 tablet (125 mcg total) by mouth daily. 30 tablet 11  . DILTIAZEM HCL ER COATED BEADS 300 MG PO CP24 Oral Take 1 capsule (300 mg total) by mouth daily. 30 capsule 11  . DORZOLAMIDE HCL 2 % OP SOLN  as directed.    . FUROSEMIDE 40 MG PO TABS Oral Take 1 tablet (40 mg total) by mouth daily. 30 tablet 8  . ISOSORBIDE MONONITRATE ER 60 MG PO TB24 Oral Take 1 tablet (60 mg total) by mouth daily. 30 tablet 11  . LATANOPROST 0.005 % OP SOLN  as directed.    Marland Kitchen LEVOTHYROXINE SODIUM 100 MCG PO TABS Oral Take 1 tablet (100 mcg total) by mouth daily. 31 tablet 11  . METOPROLOL SUCCINATE ER 100 MG PO TB24  Take by mouth 1/2 tablet twice daily 30 tablet 6  . NITROGLYCERIN 0.4 MG SL SUBL Sublingual Place 0.4 mg under the tongue every 5 (five) minutes as needed. For chest pains     . POTASSIUM CHLORIDE CRYS ER 20 MEQ PO TBCR Oral Take 1 tablet (20 mEq total) by mouth every other day. 90 tablet 3  . WARFARIN SODIUM 5 MG PO TABS  Take as directed by coumadin clinic 30 tablet 3    BP 164/108  Pulse 120  Temp 99.8 F (37.7 C) (Oral)  Resp 20  SpO2 95% BP 145/74  Pulse 122  Temp 100.3 F (37.9 C) (Oral)  Resp 16  SpO2 99%  Physical Exam CONSTITUTIONAL: Well developed/well nourished HEAD AND FACE: Normocephalic/atraumatic EYES: EOMI/PERRL ENMT: Mucous membranes moist, uvula midline NECK: supple no meningeal signs SPINE:entire spine nontender CV: irregularity noted LUNGS: coarse BS noted bilaterally but no apparent distress ABDOMEN: soft, nontender, no rebound or guarding GU:no cva tenderness NEURO: Pt is awake/alert, moves all extremitiesx4 EXTREMITIES:  full ROM. Symmetric pitting edema noted to her LE SKIN: warm, color normal PSYCH: no abnormalities of mood noted  ED Course  Procedures  Labs Reviewed  CBC - Abnormal; Notable for the following:    WBC 13.5 (*)     All other components within normal limits   BASIC METABOLIC PANEL - Abnormal; Notable for the following:    Glucose, Bld 214 (*)     GFR calc non Af Amer 57 (*)     GFR calc Af Amer 66 (*)     All other components within normal limits  PROTIME-INR - Abnormal; Notable for the following:    Prothrombin Time 28.4 (*)     INR 2.84 (*)     All other components within normal limits  DIGOXIN LEVEL - Abnormal; Notable for the following:    Digoxin Level 0.7 (*)     All other components within normal limits  POCT I-STAT TROPONIN I   Dg Chest 2 View  12/30/2011  *RADIOLOGY REPORT*  Clinical Data: Sore throat, cough  CHEST - 2 VIEW  Comparison: 02/16/2010  Findings: Cardiomegaly again noted.  Stable nodular consolidation in the right upper lobe.  There is  new airspace disease  in  right upper lobe and right perihilar region.  This is highly suspicious for asymmetric infiltrate/pneumonia rather than asymmetric pulmonary edema.  Follow-up to resolution is recommended.  Stable degenerative changes thoracic spine.  IMPRESSION: Stable nodular consolidation in the right upper lobe.  There is new airspace disease  in right upper lobe and right perihilar region.  This is highly suspicious for asymmetric infiltrate/pneumonia rather than asymmetric pulmonary edema. Follow-up to resolution is recommended.   Original Report Authenticated By: Natasha Mead, M.D.    9:59 AM Pneumonia noted will need admission due to her age/fatigue and high risk for worsening at home  11:09 AM Pt stable D/w dr Benjamine Mola, will admit to tele Community acquired - lives at home, no recent hospital stays She has PCN allergy levaquin ordered per online pharmacy recommendation   MDM  Nursing notes including past medical history and social history reviewed and considered in documentation xrays reviewed and considered Labs/vital reviewed and considered        Date: 12/30/2011  Rate: 121  Rhythm: atrial fibrillation  QRS Axis: normal  Intervals: normal  ST/T Wave  abnormalities: nonspecific ST changes  Conduction Disutrbances:none  Narrative Interpretation: poor quality, st depression laterally  Old EKG Reviewed: changes noted and rate is faster today    Joya Gaskins, MD 12/30/11 1110

## 2011-12-30 NOTE — ED Notes (Signed)
Pt brought to room from radiology department; pt undressed, in gown, on monitor, continuous pulse oximetry, blood pressure cuff and oxygen  (2L); [place], MD speaking with pt at this time; family at bedside

## 2011-12-30 NOTE — ED Notes (Addendum)
Pt resting quietly.

## 2011-12-30 NOTE — Progress Notes (Signed)
ANTIBIOTIC CONSULT NOTE - INITIAL  Pharmacy Consult for Coumadin, Adjustment of antibiotics for renal function Indication: rule out pneumonia, afib  Allergies  Allergen Reactions  . Famotidine   . Penicillins   . Sulfa Antibiotics   . Tetracyclines & Related     Patient Measurements: Weight: 102 lb 1.2 oz (46.3 kg) CrCl ~30 ml/min  Vital Signs: Temp: 98.7 F (37.1 C) (10/19 1413) Temp src: Oral (10/19 1413) BP: 156/77 mmHg (10/19 1413) Pulse Rate: 132  (10/19 1413) Intake/Output from previous day:   Intake/Output from this shift:    Labs:  [facility] 12/30/11 0855  WBC 13.5*  HGB 12.7  PLT 252  LABCREA --  CREATININE 0.84   The CrCl is unknown because both a height and weight (above a minimum accepted value) are required for this calculation. No results found for this basename: VANCOTROUGH:2,VANCOPEAK:2,VANCORANDOM:2,GENTTROUGH:2,GENTPEAK:2,GENTRANDOM:2,TOBRATROUGH:2,TOBRAPEAK:2,TOBRARND:2,AMIKACINPEAK:2,AMIKACINTROU:2,AMIKACIN:2, in the last 72 hours   Microbiology: No results found for this or any previous visit (from the past 720 hour(s)).  Medical History: Past Medical History  Diagnosis Date  . Atrial fibrillation with controlled ventricular response   . Hypothyroidism   . Hypertension   . Diastolic CHF, acute on chronic   . Valvular heart disease     with severe mr,tr,and mod pulmonary regurgitation  . Breast cancer     Medications:  Prescriptions prior to admission  Medication Sig Dispense Refill  . aspirin 81 MG tablet Take 81 mg by mouth daily.        . chlordiazePOXIDE (LIBRIUM) 5 MG capsule Take 1 capsule (5 mg total) by mouth daily as needed. For nerves  30 capsule  5  . digoxin (LANOXIN) 0.125 MG tablet Take 1 tablet (125 mcg total) by mouth daily.  30 tablet  11  . diltiazem (CARDIZEM CD) 300 MG 24 hr capsule Take 1 capsule (300 mg total) by mouth daily.  30 capsule  11  . dorzolamide (TRUSOPT) 2 % ophthalmic solution Place 1 drop into both  eyes 2 (two) times daily.       . furosemide (LASIX) 40 MG tablet Take 1 tablet (40 mg total) by mouth daily.  30 tablet  8  . isosorbide mononitrate (IMDUR) 60 MG 24 hr tablet Take 1 tablet (60 mg total) by mouth daily.  30 tablet  11  . latanoprost (XALATAN) 0.005 % ophthalmic solution Place 1 drop into both eyes at bedtime.       Marland Kitchen levothyroxine (SYNTHROID, LEVOTHROID) 100 MCG tablet Take 1 tablet (100 mcg total) by mouth daily.  31 tablet  11  . metoprolol succinate (TOPROL-XL) 100 MG 24 hr tablet Take 50 mg by mouth 2 (two) times daily. Take with or immediately following a meal.      . nitroGLYCERIN (NITROSTAT) 0.4 MG SL tablet Place 0.4 mg under the tongue every 5 (five) minutes as needed. For chest pains       . potassium chloride SA (K-DUR,KLOR-CON) 20 MEQ tablet Take 1 tablet (20 mEq total) by mouth every other day.  90 tablet  3  . warfarin (COUMADIN) 5 MG tablet Take 5 mg by mouth every other day.       Assessment: 78 yo lady admitted with CAP to continue levaquin dosing and coumadin for afib.  Her INR on admit is therapeutic at 2.84.  She received 750 mg levaquin in the ED.  Est CrCl ~30 ml/min.  Goal of Therapy:  Eradication of infection INR 2-3  Plan:  Continue levaquin 750 mg IV q48 hours  for 5 days. Continue home coumadin dose. F/u daily INR. F/u clinical progress, renal function and monitor for S&S bleeding.  Talbert Cage Poteet 12/30/2011,3:04 PM

## 2011-12-30 NOTE — ED Notes (Signed)
Pt here c/o generalized weakness, cough and sore throat since getting flu shot on Tuesday; pt sts never had problem with vaccine in past; pt denies any new SOB; pt noted to have irregular HR

## 2011-12-30 NOTE — H&P (Signed)
[facility] History and Physical  BERNETTE SEEMAN UJW:[id number] DOB: [birth year] DOA: 12/30/2011  Referring physician: er PCP: Cassell Clement, MD  Specialists:   Chief Complaint: cough  HPI: [name] is a 78 y.o. female  Who came to the ER with cough, sore throat.  She recently had her flu shot.  Patient is very hard of hearing.  Cough is non productive.  +chills, +fatigue Feeling better since she was in ER. No N/V/D   Review of Systems: all systems reviewed, negative unless stated above   Past Medical History  Diagnosis Date  . Atrial fibrillation with controlled ventricular response   . Hypothyroidism   . Hypertension   . Diastolic CHF, acute on chronic   . Valvular heart disease     with severe mr,tr,and mod pulmonary regurgitation  . Breast cancer    Past Surgical History  Procedure Date  . Mastectomy   . Oopherectomy   . Appendectomy   . Cholecystectomy    Social History:  reports that she has never smoked. She does not have any smokeless tobacco history on file. Her alcohol and drug histories not on file. Very functional, drives, lives by self  Allergies  Allergen Reactions  . Famotidine   . Penicillins   . Sulfa Antibiotics   . Tetracyclines & Related     Family Hx: +DM  Prior to Admission medications   Medication Sig Start Date End Date Taking? Authorizing Provider  aspirin 81 MG tablet Take 81 mg by mouth daily.     Yes Historical Provider, MD  chlordiazePOXIDE (LIBRIUM) 5 MG capsule Take 1 capsule (5 mg total) by mouth daily as needed. For nerves 06/26/11  Yes Cassell Clement, MD  digoxin (LANOXIN) 0.125 MG tablet Take 1 tablet (125 mcg total) by mouth daily. 09/11/11  Yes Cassell Clement, MD  diltiazem (CARDIZEM CD) 300 MG 24 hr capsule Take 1 capsule (300 mg total) by mouth daily. 09/11/11  Yes Cassell Clement, MD  dorzolamide (TRUSOPT) 2 % ophthalmic solution Place 1 drop into both eyes 2 (two) times daily.  10/07/11  Yes Historical  Provider, MD  furosemide (LASIX) 40 MG tablet Take 1 tablet (40 mg total) by mouth daily. 08/01/11  Yes Cassell Clement, MD  isosorbide mononitrate (IMDUR) 60 MG 24 hr tablet Take 1 tablet (60 mg total) by mouth daily. 05/22/11  Yes Cassell Clement, MD  latanoprost (XALATAN) 0.005 % ophthalmic solution Place 1 drop into both eyes at bedtime.  10/10/11  Yes Historical Provider, MD  levothyroxine (SYNTHROID, LEVOTHROID) 100 MCG tablet Take 1 tablet (100 mcg total) by mouth daily. 06/26/11  Yes Cassell Clement, MD  metoprolol succinate (TOPROL-XL) 100 MG 24 hr tablet Take 50 mg by mouth 2 (two) times daily. Take with or immediately following a meal.   Yes Historical Provider, MD  nitroGLYCERIN (NITROSTAT) 0.4 MG SL tablet Place 0.4 mg under the tongue every 5 (five) minutes as needed. For chest pains    Yes Historical Provider, MD  potassium chloride SA (K-DUR,KLOR-CON) 20 MEQ tablet Take 1 tablet (20 mEq total) by mouth every other day. 05/01/11  Yes Cassell Clement, MD  warfarin (COUMADIN) 5 MG tablet Take 5 mg by mouth every other day.   Yes Historical Provider, MD   Physical Exam: Filed Vitals:   12/30/11 0845 12/30/11 1009  BP: 164/108 145/74  Pulse: 120 122  Temp: 99.8 F (37.7 C) 100.3 F (37.9 C)  TempSrc: Oral Oral  Resp: 20 16  SpO2: 95% 99%  General:  Hard of hearing, A+Ox3, NAD  Eyes: wnl  ENT: mucous membranes dry  Neck: no JVD  Cardiovascular: irregular, fast at times  Respiratory: coarse upper lobe breath sounds  Abdomen: +BS, soft, NT/ND  Skin: few areas of bruising  Musculoskeletal: moves all 4 extremitites  Psychiatric: no SI/no HI  Neurologic: CN 2-12 intact  Labs on Admission:  Basic Metabolic Panel:  Lab 12/30/11 5784  NA 139  K 3.7  CL 102  CO2 25  GLUCOSE 214*  BUN 14  CREATININE 0.84  CALCIUM 9.0  MG --  PHOS --   Liver Function Tests: No results found for this basename: AST:5,ALT:5,ALKPHOS:5,BILITOT:5,PROT:5,ALBUMIN:5 in the last  168 hours No results found for this basename: LIPASE:5,AMYLASE:5 in the last 168 hours No results found for this basename: AMMONIA:5 in the last 168 hours CBC:  Lab 12/30/11 0855  WBC 13.5*  NEUTROABS --  HGB 12.7  HCT 37.8  MCV 86.5  PLT 252   Cardiac Enzymes: No results found for this basename: CKTOTAL:5,CKMB:5,CKMBINDEX:5,TROPONINI:5 in the last 168 hours  BNP (last 3 results) No results found for this basename: PROBNP:3 in the last 8760 hours CBG: No results found for this basename: GLUCAP:5 in the last 168 hours  Radiological Exams on Admission: Dg Chest 2 View  12/30/2011  *RADIOLOGY REPORT*  Clinical Data: Sore throat, cough  CHEST - 2 VIEW  Comparison: 02/16/2010  Findings: Cardiomegaly again noted.  Stable nodular consolidation in the right upper lobe.  There is  new airspace disease  in  right upper lobe and right perihilar region.  This is highly suspicious for asymmetric infiltrate/pneumonia rather than asymmetric pulmonary edema.  Follow-up to resolution is recommended.  Stable degenerative changes thoracic spine.  IMPRESSION: Stable nodular consolidation in the right upper lobe.  There is new airspace disease  in right upper lobe and right perihilar region.  This is highly suspicious for asymmetric infiltrate/pneumonia rather than asymmetric pulmonary edema. Follow-up to resolution is recommended.   Original Report Authenticated By: Natasha Mead, M.D.     EKG: Independently reviewed. A fib with RVR  Assessment/Plan Principal Problem:  *Community acquired pneumonia Active Problems:  Atrial fibrillation with RVR  Cough  Sore throat   1. CAP- abx, nebs, O2  2. Weakness- PT 3. A fib- give home medications, continue coumadin, may need cardizem gtt if HR gets worse 4. Cough- PRN medications 5. Sore throat- PRN  Code Status: DNR- discussed with son Family Communication: patient and son at bedside Disposition Plan: 2-3 days- hope to get her home  Time spent: 70  min  Benjamine Mola JESSICA [facility] Pager [phone]  If 7PM-7AM, please contact night-coverage [email or web address] Password [email or web address] 12/30/2011, 12:51 PM

## 2011-12-31 DIAGNOSIS — I10 Essential (primary) hypertension: Secondary | ICD-10-CM

## 2011-12-31 LAB — BASIC METABOLIC PANEL
BUN: 17 mg/dL (ref 6–23)
BUN: 19 mg/dL (ref 6–23)
CO2: 22 mEq/L (ref 19–32)
Chloride: 105 mEq/L (ref 96–112)
Chloride: 103 mEq/L (ref 96–112)
Creatinine, Ser: 0.74 mg/dL (ref 0.50–1.10)
GFR calc Af Amer: 81 mL/min — ABNORMAL LOW (ref >90)
GFR calc Af Amer: 59 mL/min — ABNORMAL LOW (ref >90)
GFR calc non Af Amer: 51 mL/min — ABNORMAL LOW (ref >90)
Glucose, Bld: 101 mg/dL — ABNORMAL HIGH (ref 70–99)
Potassium: 6 mEq/L — ABNORMAL HIGH (ref 3.5–5.1)
Potassium: 4 mEq/L (ref 3.5–5.1)
Sodium: 134 mEq/L — ABNORMAL LOW (ref 135–145)

## 2011-12-31 LAB — CBC
HCT: 30 % — ABNORMAL LOW (ref 36.0–46.0)
Hemoglobin: 10.3 g/dL — ABNORMAL LOW (ref 12.0–15.0)
MCHC: 34.3 g/dL (ref 30.0–36.0)
MCV: 86.2 fL (ref 78.0–100.0)
RDW: 15.9 % — ABNORMAL HIGH (ref 11.5–15.5)

## 2011-12-31 LAB — PROTIME-INR: Prothrombin Time: 36.7 seconds — ABNORMAL HIGH (ref 11.6–15.2)

## 2011-12-31 NOTE — Progress Notes (Signed)
[facility] PROGRESS NOTE  [name] NWG:[id number] DOB: 06/10/1914 DOA: 12/30/2011 PCP: Cassell Clement, MD  Assessment/Plan: 1. CAP- abx day #2, nebs, O2 - wean off if tolerated 2. Weakness- PT- did well 3. A fib- give home medications, continue coumadin, HR controlled 4. Cough- PRN medications 5. Sore throat- PRN 6. Hyperkalemia- prob hemolysis- recheck  Code Status: DNR Family Communication: patient at bedside Disposition Plan: home when better- hope on Monday   Consultants:  none  Procedures:  none   HPI/Subjective: Patient feeling better, +cough  Objective: Filed Vitals:   12/30/11 1554 12/30/11 2359 12/31/11 0618 12/31/11 1042  BP:  105/49 107/66 117/57  Pulse: 132 84 84 79  Temp:  98.8 F (37.1 C) 97.2 F (36.2 C)   TempSrc:  Oral Oral   Resp:  18 18   Height:      Weight:   45.9 kg (101 lb 3.1 oz)   SpO2:  99% 99%     Intake/Output Summary (Last 24 hours) at 12/31/11 1147 Last data filed at 12/31/11 0831  Gross per 24 hour  Intake    890 ml  Output    810 ml  Net     80 ml   Filed Weights   12/30/11 1413 12/31/11 0618  Weight: 46.3 kg (102 lb 1.2 oz) 45.9 kg (101 lb 3.1 oz)    Exam:   General:  A+Ox3, NAD  Cardiovascular: irregular  Respiratory: clear anterior with cough  Abdomen: +BS, soft, NT/ND  Data Reviewed: Basic Metabolic Panel:  Lab 12/31/11 5784 12/30/11 0855  NA 137 139  K 6.0* 3.7  CL 105 102  CO2 22 25  GLUCOSE 101* 214*  BUN 17 14  CREATININE 0.74 0.84  CALCIUM 8.3* 9.0  MG -- --  PHOS -- --   Liver Function Tests: No results found for this basename: AST:5,ALT:5,ALKPHOS:5,BILITOT:5,PROT:5,ALBUMIN:5 in the last 168 hours No results found for this basename: LIPASE:5,AMYLASE:5 in the last 168 hours No results found for this basename: AMMONIA:5 in the last 168 hours CBC:  Lab 12/31/11 0520 12/30/11 0855  WBC 9.8 13.5*  NEUTROABS -- --  HGB 10.3* 12.7  HCT 30.0* 37.8  MCV 86.2 86.5  PLT 397  252   Cardiac Enzymes: No results found for this basename: CKTOTAL:5,CKMB:5,CKMBINDEX:5,TROPONINI:5 in the last 168 hours BNP (last 3 results) No results found for this basename: PROBNP:3 in the last 8760 hours CBG:  Lab 12/30/11 1338  GLUCAP 130*    Recent Results (from the past 240 hour(s))  MRSA PCR SCREENING     Status: Abnormal   Collection Time   12/30/11  6:29 PM      Component Value Range Status Comment   MRSA by PCR POSITIVE (*) NEGATIVE Final      Studies: Dg Chest 2 View  12/30/2011  *RADIOLOGY REPORT*  Clinical Data: Sore throat, cough  CHEST - 2 VIEW  Comparison: 02/16/2010  Findings: Cardiomegaly again noted.  Stable nodular consolidation in the right upper lobe.  There is  new airspace disease  in  right upper lobe and right perihilar region.  This is highly suspicious for asymmetric infiltrate/pneumonia rather than asymmetric pulmonary edema.  Follow-up to resolution is recommended.  Stable degenerative changes thoracic spine.  IMPRESSION: Stable nodular consolidation in the right upper lobe.  There is new airspace disease  in right upper lobe and right perihilar region.  This is highly suspicious for asymmetric infiltrate/pneumonia rather than asymmetric pulmonary edema. Follow-up to resolution is recommended.  Original Report Authenticated By: Natasha Mead, M.D.     Scheduled Meds:   . aspirin  81 mg Oral QHS  . Chlorhexidine Gluconate Cloth  6 each Topical Q0600  . digoxin  125 mcg Oral Daily  . diltiazem  300 mg Oral Daily  . dorzolamide  1 drop Both Eyes BID  . isosorbide mononitrate  60 mg Oral Daily  . latanoprost  1 drop Both Eyes QHS  . levofloxacin (LEVAQUIN) IV  750 mg Intravenous Once  . levofloxacin (LEVAQUIN) IV  750 mg Intravenous Q48H  . levothyroxine  100 mcg Oral Daily  . metoprolol succinate  50 mg Oral BID  . mupirocin ointment  1 application Nasal BID  . potassium chloride SA  20 mEq Oral QODAY  . warfarin  5 mg Oral Q48H  . Warfarin -  Pharmacist Dosing Inpatient   Does not apply q1800  . DISCONTD: aspirin  81 mg Oral Daily  . DISCONTD: aspirin  81 mg Oral Daily   Continuous Infusions:   . sodium chloride 50 mL/hr at 12/30/11 1854    Principal Problem:  *Community acquired pneumonia Active Problems:  Atrial fibrillation with RVR  Cough  Sore throat  Leukocytosis    Time spent: 25    Marlin Canary  [facility] Pager [phone] 12/31/2011, 11:47 AM  LOS: 1 day

## 2011-12-31 NOTE — Progress Notes (Signed)
UR completed 

## 2011-12-31 NOTE — Progress Notes (Signed)
Notified Dr. Benjamine Mola pt's K+ 6.0 this am.  MD instructed that  specemin was hemollized and has ordered another lab work.  Amanda Pea, Charity fundraiser.

## 2011-12-31 NOTE — Progress Notes (Signed)
ANTICOAGULATION CONSULT NOTE - Follow Up Consult  Pharmacy Consult for Coumadin Indication: atrial fibrillation  Allergies  Allergen Reactions  . Famotidine   . Penicillins   . Sulfa Antibiotics   . Tetracyclines & Related     Patient Measurements: Height: 5\' 2"  (157.5 cm) Weight: 101 lb 3.1 oz (45.9 kg) IBW/kg (Calculated) : 50.1   Vital Signs: Temp: 97.9 F (36.6 C) (10/20 1517) Temp src: Oral (10/20 1517) BP: 102/46 mmHg (10/20 1517) Pulse Rate: 65  (10/20 1517)  Labs:  Basename 12/31/11 1517 12/31/11 1100 12/31/11 0520 12/30/11 0855 12/29/11 1102  HGB -- -- 10.3* 12.7 --  HCT -- -- 30.0* 37.8 --  PLT -- -- 397 252 --  APTT -- -- -- -- --  LABPROT 36.7* -- -- 28.4* --  INR 4.02* -- -- 2.84* 3.2  HEPARINUNFRC -- -- -- -- --  CREATININE -- 0.92 0.74 0.84 --  CKTOTAL -- -- -- -- --  CKMB -- -- -- -- --  TROPONINI -- -- -- -- --    Estimated Creatinine Clearance: 26.5 ml/min (by C-G formula based on Cr of 0.92).   Medications:  Scheduled:    . aspirin  81 mg Oral QHS  . Chlorhexidine Gluconate Cloth  6 each Topical Q0600  . digoxin  125 mcg Oral Daily  . diltiazem  300 mg Oral Daily  . dorzolamide  1 drop Both Eyes BID  . isosorbide mononitrate  60 mg Oral Daily  . latanoprost  1 drop Both Eyes QHS  . levofloxacin (LEVAQUIN) IV  750 mg Intravenous Q48H  . levothyroxine  100 mcg Oral Daily  . metoprolol succinate  50 mg Oral BID  . mupirocin ointment  1 application Nasal BID  . potassium chloride SA  20 mEq Oral QODAY  . warfarin  5 mg Oral Q48H  . Warfarin - Pharmacist Dosing Inpatient   Does not apply q1800    Assessment: 78 yo female on chronic coumadin for Afib. INR is supratherapeutic today. No bleeding noted.  Home dose: 5 mg every other day  Goal of Therapy:  INR 2-3   Plan:  -No Coumadin due today, discontinue standing order -INR daily  [facility], [address].D., BCPS Clinical Pharmacist Pager: [phone] 12/31/2011 5:01 PM

## 2011-12-31 NOTE — Evaluation (Signed)
Physical Therapy Evaluation Patient Details Name:  PAONE MRN: [id number] DOB: September 12, 1914 Today's Date: 12/31/2011 Time: 4098-1191 PT Time Calculation (min): 12 min  PT Assessment / Plan / Recommendation Clinical Impression  Patient is a 78 yo female admitted with CAP.  Patient independent with all mobility and gait, with good balance.  No acute PT needs identified - PT will sign off.    PT Assessment  Patent does not need any further PT services    Follow Up Recommendations  No PT follow up;Supervision - Intermittent    Does the patient have the potential to tolerate intense rehabilitation      Barriers to Discharge        Equipment Recommendations  None recommended by PT    Recommendations for Other Services     Frequency      Precautions / Restrictions Precautions Precautions: None Restrictions Weight Bearing Restrictions: No   Pertinent Vitals/Pain       Mobility  Bed Mobility Bed Mobility: Supine to Sit;Sit to Supine Supine to Sit: 7: Independent;HOB flat Sit to Supine: 7: Independent;HOB flat Details for Bed Mobility Assistance: No cues or assist needed Transfers Transfers: Sit to Stand;Stand to Sit Sit to Stand: 7: Independent;From bed Stand to Sit: 7: Independent;To bed Details for Transfer Assistance: No cues or assist needed. Ambulation/Gait Ambulation/Gait Assistance: 7: Independent Ambulation Distance (Feet): 300 Feet Assistive device: None Ambulation/Gait Assistance Details: Patient with good gait pattern and balance with gait.  No assist needed Gait Pattern: Within Functional Limits Gait velocity: [facility]      PT Goals  N/A  Visit Information  Last PT Received On: 12/31/11 Assistance Needed: +1    Subjective Data  Subjective: "I think I do OK for 78 years old." Patient Stated Goal: To return home soon   Prior Functioning  Home Living Lives With: Alone Available Help at Discharge: Family;Available PRN/intermittently Type of  Home: Mobile home Home Access: Level entry Home Layout: One level Home Adaptive Equipment: Walker - standard Prior Function Level of Independence: Independent Able to Take Stairs?: Yes Driving: Yes Vocation: Retired Musician: HOH Dominant Hand: Right    Cognition  Overall Cognitive Status: Appears within functional limits for tasks assessed/performed Arousal/Alertness: Awake/alert Orientation Level: Oriented X4 / Intact Behavior During Session: WFL for tasks performed    Extremity/Trunk Assessment Right Upper Extremity Assessment RUE ROM/Strength/Tone: Within functional levels Left Upper Extremity Assessment LUE ROM/Strength/Tone: Within functional levels Right Lower Extremity Assessment RLE ROM/Strength/Tone: Within functional levels RLE Sensation: WFL - Light Touch Left Lower Extremity Assessment LLE ROM/Strength/Tone: Within functional levels LLE Sensation: WFL - Light Touch   Balance Balance Balance Assessed: Yes High Level Balance High Level Balance Activites: Turns;Sudden stops;Head turns High Level Balance Comments: No loss of balance with high level balance activities  End of Session PT - End of Session Equipment Utilized During Treatment: Gait belt Activity Tolerance: Patient tolerated treatment well Patient left: in bed;with call bell/phone within reach Nurse Communication: Mobility status (Encouraged patient to ambulate in hallway with nursing)  GP     Vena Austria 12/31/2011, 10:04 AM Durenda Hurt. Renaldo Fiddler, [facility] Acute Rehab Services Pager [phone]

## 2012-01-01 DIAGNOSIS — F411 Generalized anxiety disorder: Secondary | ICD-10-CM

## 2012-01-01 LAB — LEGIONELLA ANTIGEN, URINE

## 2012-01-01 LAB — PROTIME-INR
INR: 3.38 — ABNORMAL HIGH (ref 0.00–1.49)
Prothrombin Time: 32.3 seconds — ABNORMAL HIGH (ref 11.6–15.2)

## 2012-01-01 NOTE — Progress Notes (Signed)
Patient seen and examined by me.  Seems to be more SOB today, continue with supportive care.  Hope for d/c tomm  Marlin Canary DO

## 2012-01-01 NOTE — Progress Notes (Signed)
Occupational Therapy Evaluation Patient Details Name: [name] MRN: [id number] DOB: [birth year] Today's Date: 01/01/2012 Time: 4782-9562 OT Time Calculation (min): 23 min  OT Assessment / Plan / Recommendation Clinical Impression  Pt making steady progress. ambulated RA x 80 ft with O2 sats 95. SOB  noted. Pt c/o occasional "lightheadedness". Feel that pt will benefit from [facility] given prior level of independence and current decreased endurance. Rec that pt initially have 24/7 supervision after D/C, then intermittent S as strength and endurance improves. Also recommended for pt to use showerseat when bathing to decrease risk of falls.    OT Assessment  All further OT needs can be met in the next venue of care    Follow Up Recommendations  Home health OT    Barriers to Discharge  none    Equipment Recommendations  Tub/shower seat    Recommendations for Other Services  HHPT  Frequency    eval only   Precautions / Restrictions Precautions Precautions: None   Pertinent Vitals/Pain No pain. O2 sats 95 RA during activity.    ADL  Grooming: Performed;Modified independent Where Assessed - Grooming: Unsupported standing Upper Body Bathing: Simulated;Modified independent Where Assessed - Upper Body Bathing: Unsupported sitting Lower Body Bathing: Simulated;Supervision/safety Where Assessed - Lower Body Bathing: Unsupported sit to stand Upper Body Dressing: Simulated;Modified independent Where Assessed - Upper Body Dressing: Unsupported sitting Lower Body Dressing: Performed;Supervision/safety Where Assessed - Lower Body Dressing: Unsupported sit to stand Toilet Transfer: Simulated;Modified independent Toilet Transfer Method: Sit to Barista: Bedside commode Equipment Used: Gait belt Transfers/Ambulation Related to ADLs: S ADL Comments: Pt limited by fatigue    OT Diagnosis: Generalized weakness  OT Problem List: Decreased strength;Decreased activity  tolerance;Decreased knowledge of use of DME or AE;Cardiopulmonary status limiting activity OT Treatment Interventions:     OT Goals Acute Rehab OT Goals OT Goal Formulation:  (eval only)  Visit Information  Last OT Received On: 01/01/12 Assistance Needed: +1    Subjective Data      Prior Functioning     Home Living Lives With: Alone Available Help at Discharge: Family;Available PRN/intermittently Type of Home: Mobile home Home Access: Level entry Home Layout: One level Bathroom Shower/Tub: Health visitor: Standard Bathroom Accessibility: Yes How Accessible: Accessible via walker Home Adaptive Equipment: Walker - standard Prior Function Level of Independence: Independent Able to Take Stairs?: Yes Driving: Yes Vocation: Retired Musician: HOH Dominant Hand: Right         Vision/Perception  WFL   Cognition  Overall Cognitive Status: Appears within functional limits for tasks assessed/performed Arousal/Alertness: Awake/alert Orientation Level: Oriented X4 / Intact Behavior During Session: WFL for tasks performed    Extremity/Trunk Assessment Right Upper Extremity Assessment RUE ROM/Strength/Tone: [facility] for tasks assessed Left Upper Extremity Assessment LUE ROM/Strength/Tone: WFL for tasks assessed Right Lower Extremity Assessment RLE ROM/Strength/Tone: [facility] for tasks assessed Left Lower Extremity Assessment LLE ROM/Strength/Tone: WFL for tasks assessed Trunk Assessment Trunk Assessment: Normal     Mobility Bed Mobility Bed Mobility: Supine to Sit Supine to Sit: 6: Modified independent (Device/Increase time) Sit to Supine: 6: Modified independent (Device/Increase time) Transfers Transfers: Sit to Stand;Stand to Sit Sit to Stand: 6: Modified independent (Device/Increase time) Stand to Sit: 6: Modified independent (Device/Increase time)     Shoulder Instructions     Exercise     Balance  Occasional sway noted   End  of Session OT - End of Session Equipment Utilized During Treatment: Gait belt Activity Tolerance: Patient  tolerated treatment well Patient left: in chair;with call bell/phone within reach Nurse Communication: Mobility status  GO     Hurley Blevins,HILLARY 01/01/2012, 5:57 PM [facility], OTR/L  [phone] 01/01/2012

## 2012-01-01 NOTE — Progress Notes (Signed)
SATURATION QUALIFICATIONS:  Patient Saturations on Room Air at Rest = 95%  Patient Saturations on Room Air while Ambulating95%  Patient Saturations on 0 Liters of oxygen while Ambulating =none  Statement of medical necessity for home oxygen:

## 2012-01-01 NOTE — Progress Notes (Signed)
ANTICOAGULATION CONSULT NOTE - Follow Up Consult  Pharmacy Consult for Coumadin Indication: atrial fibrillation  Labs:  Basename 01/01/12 0445 12/31/11 1517 12/31/11 1100 12/31/11 0520 12/30/11 0855  HGB -- -- -- 10.3* 12.7  HCT -- -- -- 30.0* 37.8  PLT -- -- -- 397 252  APTT -- -- -- -- --  LABPROT 32.3* 36.7* -- -- 28.4*  INR 3.38* 4.02* -- -- 2.84*  HEPARINUNFRC -- -- -- -- --  CREATININE -- -- 0.92 0.74 0.84  CKTOTAL -- -- -- -- --  CKMB -- -- -- -- --  TROPONINI -- -- -- -- --    Estimated Creatinine Clearance: 27.6 ml/min (by C-G formula based on Cr of 0.92).  Assessment: 78 yo female on chronic coumadin for Afib. INR is supratherapeutic today. No bleeding noted.  Home dose: 5 mg every other day  Goal of Therapy:  INR 2-3   Plan:  -No Coumadin today -If home today, recommend resuming 5 mg every other day on Wednesday -INR daily  Okey Regal, PharmD [phone]  01/01/2012 8:36 AM

## 2012-01-01 NOTE — Progress Notes (Signed)
[facility] PROGRESS NOTE  [name] GNF:[id number] DOB: 04-Oct-1914 DOA: 12/30/2011 PCP: Cassell Clement, MD  Assessment/Plan: 1. CAP- abx day #3, nebs, O2 - wean off if tolerated. O2 94-98 on 2L. Pt reports feeling sob without o2. Will ambulate and check sat 2. Weakness- PT- did well. Continue to walk in hall 3. A fib- give home medications, continue coumadin, HR controlled 4. Cough-  Somewhat improved per pt. PRN medications 5. Sore throat-  Better per pt. PRN 6. Hyperkalemia- prob hemolysis- recheck    Code Status: DNR Family Communication: Pt at bedside Disposition Plan: home when ready late today or in am   Consultants:  nonr  Procedures:  nonr  Antibiotics:  Levaquin 12/30/11  HPI/Subjective: Sitting up in bed eating. Reports "i don't feel so well. I get so sob with doing the littlest thing". Denies CP/palpitation, nausea. NAD  Objective: Filed Vitals:   12/31/11 1042 12/31/11 1517 12/31/11 2100 01/01/12 0555  BP: 117/57 102/46 134/56 137/96  Pulse: 79 65 81 80  Temp:  97.9 F (36.6 C) 99.2 F (37.3 C) 98 F (36.7 C)  TempSrc:  Oral Oral Oral  Resp:  18 18 18   Height:      Weight:    47.764 kg (105 lb 4.8 oz)  SpO2:  94% 98%     Intake/Output Summary (Last 24 hours) at 01/01/12 0759 Last data filed at 01/01/12 0500  Gross per 24 hour  Intake    240 ml  Output   1460 ml  Net  -1220 ml   Filed Weights   12/30/11 1413 12/31/11 0618 01/01/12 0555  Weight: 46.3 kg (102 lb 1.2 oz) 45.9 kg (101 lb 3.1 oz) 47.764 kg (105 lb 4.8 oz)    Exam:   General:  Alert, well nourished oriented. NAD  Cardiovascular: irregular. No MGR No LEE  Respiratory: mild increased work of breathing with conversation. BS distant but clear. No wheeze  Abdomen: flat soft +BS non-tender  Data Reviewed: Basic Metabolic Panel:  Lab 12/31/11 8469 12/31/11 0520 12/30/11 0855  NA 134* 137 139  K 4.0 6.0* 3.7  CL 103 105 102  CO2 21 22 25   GLUCOSE 108* 101*  214*  BUN 19 17 14   CREATININE 0.92 0.74 0.84  CALCIUM 8.4 8.3* 9.0  MG -- -- --  PHOS -- -- --   Liver Function Tests: No results found for this basename: AST:5,ALT:5,ALKPHOS:5,BILITOT:5,PROT:5,ALBUMIN:5 in the last 168 hours No results found for this basename: LIPASE:5,AMYLASE:5 in the last 168 hours No results found for this basename: AMMONIA:5 in the last 168 hours CBC:  Lab 12/31/11 0520 12/30/11 0855  WBC 9.8 13.5*  NEUTROABS -- --  HGB 10.3* 12.7  HCT 30.0* 37.8  MCV 86.2 86.5  PLT 397 252   Cardiac Enzymes: No results found for this basename: CKTOTAL:5,CKMB:5,CKMBINDEX:5,TROPONINI:5 in the last 168 hours BNP (last 3 results) No results found for this basename: PROBNP:3 in the last 8760 hours CBG:  Lab 12/30/11 1338  GLUCAP 130*    Recent Results (from the past 240 hour(s))  CULTURE, BLOOD (ROUTINE X 2)     Status: Normal (Preliminary result)   Collection Time   12/30/11  3:49 PM      Component Value Range Status Comment   Specimen Description BLOOD HAND LEFT   Final    Special Requests BOTTLES DRAWN AEROBIC ONLY 2CC   Final    Culture  Setup Time 12/30/2011 21:57   Final    Culture  Final    Value:        BLOOD CULTURE RECEIVED NO GROWTH TO DATE CULTURE WILL BE HELD FOR 5 DAYS BEFORE ISSUING A FINAL NEGATIVE REPORT   Report Status PENDING   Incomplete   CULTURE, BLOOD (ROUTINE X 2)     Status: Normal (Preliminary result)   Collection Time   12/30/11  3:52 PM      Component Value Range Status Comment   Specimen Description BLOOD HAND LEFT   Final    Special Requests BOTTLES DRAWN AEROBIC ONLY 2CC   Final    Culture  Setup Time 12/30/2011 21:57   Final    Culture     Final    Value:        BLOOD CULTURE RECEIVED NO GROWTH TO DATE CULTURE WILL BE HELD FOR 5 DAYS BEFORE ISSUING A FINAL NEGATIVE REPORT   Report Status PENDING   Incomplete   MRSA PCR SCREENING     Status: Abnormal   Collection Time   12/30/11  6:29 PM      Component Value Range Status  Comment   MRSA by PCR POSITIVE (*) NEGATIVE Final      Studies: Dg Chest 2 View  12/30/2011  *RADIOLOGY REPORT*  Clinical Data: Sore throat, cough  CHEST - 2 VIEW  Comparison: 02/16/2010  Findings: Cardiomegaly again noted.  Stable nodular consolidation in the right upper lobe.  There is  new airspace disease  in  right upper lobe and right perihilar region.  This is highly suspicious for asymmetric infiltrate/pneumonia rather than asymmetric pulmonary edema.  Follow-up to resolution is recommended.  Stable degenerative changes thoracic spine.  IMPRESSION: Stable nodular consolidation in the right upper lobe.  There is new airspace disease  in right upper lobe and right perihilar region.  This is highly suspicious for asymmetric infiltrate/pneumonia rather than asymmetric pulmonary edema. Follow-up to resolution is recommended.   Original Report Authenticated By: Natasha Mead, M.D.     Scheduled Meds:   . aspirin  81 mg Oral QHS  . Chlorhexidine Gluconate Cloth  6 each Topical Q0600  . digoxin  125 mcg Oral Daily  . diltiazem  300 mg Oral Daily  . dorzolamide  1 drop Both Eyes BID  . isosorbide mononitrate  60 mg Oral Daily  . latanoprost  1 drop Both Eyes QHS  . levofloxacin (LEVAQUIN) IV  750 mg Intravenous Q48H  . levothyroxine  100 mcg Oral Daily  . metoprolol succinate  50 mg Oral BID  . mupirocin ointment  1 application Nasal BID  . potassium chloride SA  20 mEq Oral QODAY  . Warfarin - Pharmacist Dosing Inpatient   Does not apply q1800  . DISCONTD: warfarin  5 mg Oral Q48H   Continuous Infusions:   . DISCONTD: sodium chloride 50 mL/hr at 12/30/11 1854    Principal Problem:  *Community acquired pneumonia Active Problems:  Atrial fibrillation with RVR  Cough  Sore throat  Leukocytosis    Time spent: 30 minutes    Gwenyth Bender NP [facility]  If 8PM-8AM, please contact night-coverage at [email or web address], password [facility] 01/01/2012, 7:59 AM  LOS: 2 days

## 2012-01-02 MED ORDER — WARFARIN SODIUM 2.5 MG PO TABS
2.5000 mg | ORAL_TABLET | Freq: Once | ORAL | Status: AC
  Administered 2012-01-02: 2.5 mg via ORAL
  Filled 2012-01-02: qty 1

## 2012-01-02 MED ORDER — WARFARIN SODIUM 5 MG PO TABS: ORAL_TABLET | ORAL | Status: DC

## 2012-01-02 MED ORDER — LEVOFLOXACIN 750 MG PO TABS: 750.0000 mg | ORAL_TABLET | ORAL | Status: DC

## 2012-01-02 MED ORDER — GUAIFENESIN-DM 100-10 MG/5ML PO SYRP: 5.0000 mL | ORAL_SOLUTION | ORAL | Status: DC | PRN

## 2012-01-02 MED ORDER — WARFARIN SODIUM 2.5 MG PO TABS
2.5000 mg | ORAL_TABLET | Freq: Once | ORAL | Status: DC
  Filled 2012-01-02: qty 1

## 2012-01-02 NOTE — Discharge Summary (Signed)
Physician Discharge Summary  [name] NFA:[id number] DOB: 11/08/1914 DOA: 12/30/2011  PCP: Cassell Clement, MD  Admit date: 12/30/2011 Discharge date: 01/02/2012  Time spent: 40  minutes  Recommendations for Outpatient Follow-up:  1. Discharge to home with [facility] OT. Will make appointment with PCP 1 week. Will need INR checked.   Discharge Diagnoses:  Principal Problem:  *Community acquired pneumonia Active Problems:  Atrial fibrillation with RVR  Cough  Sore throat  Leukocytosis   Discharge Condition: Pt medically stable and ready for discharge to home.   Diet recommendation: heart healthy  Filed Weights   12/31/11 0618 01/01/12 0555 01/02/12 0549  Weight: 45.9 kg (101 lb 3.1 oz) 47.764 kg (105 lb 4.8 oz) 47.5 kg (104 lb 11.5 oz)    History of present illness:   78 yo female hx afib on coumadin, HTN, Chronic diastolic HF who came to the ER 12/30/11 with cough, sore throat. She recently had her flu shot. Reports  +chills, +fatigue . Improved in ED. No N/V/D. + non-productive cough.  Chest xray yields stable nodular consolidation in the right upper lobe. There is new airspace disease in right upper lobe and right perihilar region. This is highly suspicious for asymmetric infiltrate/pneumonia rather than asymmetric pulmonary edema. Follow-up to resolution is recommended.  Admitted to tele   Hospital Course:  1. CAP-  Admitted to tele. Provided with 02 support, antibiotics, nebs. Pt experienced worsening sob on day #2 and continued to need 02 to maintain sats >90%. On day of discharge, sob resolved, sats >90 on room air with exertion. Pt feeling better. Will see Dr. Patty Sermons 01/12/12. At  Discharge pt has had 4 days avelox. Will discharge with 1 more dose.  2. Weakness- PT- did well. No recommendations for HH PT. l 3. A fib-  Rate controlled during this hospitalization. Her home medications resumed. Coumadin per pharmacy.  INR 4.02 on admission. At discharge INR 2.41. Will  change coumadin to 2.5 for 1 more dose as pt on Avelox. Then resume home dose. Will need INR check at follow up. 4. Cough- Somewhat improved per pt. PRN medications 5. Sore throat- Better per pt. PRN 6. Hyperkalemia- prob hemolysis- recheck      Procedures:  none  Consultations:  none  Discharge Exam: Filed Vitals:   01/01/12 1408 01/01/12 2203 01/02/12 0549 01/02/12 1045  BP: 134/53 142/68 133/54 137/80  Pulse: 83 83 93 112  Temp: 98.2 F (36.8 C) 98.2 F (36.8 C) 98.6 F (37 C)   TempSrc: Oral Oral Oral   Resp: 18 18 20    Height:      Weight:   47.5 kg (104 lb 11.5 oz)   SpO2: 95% 95% 98%     General: awake alert NAD Cardiovascular: irregular, no LEE PP Respiratory: normal effort, BSCTAB no crackles, wheeze  Discharge Instructions  Discharge Orders    Future Appointments: Provider: Department: Dept Phone: Center:   01/12/2012 1:30 PM Rosalio Macadamia, NP [facility] [phone] [facility]   01/23/2012 10:30 AM [facility] [phone] None   01/23/2012 11:00 AM Cassell Clement, MD [facility] [phone] [facility]     Future Orders Please Complete By Expires   Diet - low sodium heart healthy      Increase activity slowly      Discharge instructions      Comments:   No driving until Dr. Patty Sermons clears Follow up appointment with Dr. Patty Sermons 01/12/12 at 11:30   Call MD for:  persistant nausea and vomiting      Call MD for:  difficulty breathing, headache or visual disturbances      Call MD for:  persistant dizziness or light-headedness          Medication List     As of 01/02/2012 11:17 AM    TAKE these medications         aspirin 81 MG tablet   Take 81 mg by mouth daily.      chlordiazePOXIDE 5 MG capsule   Commonly known as: LIBRIUM   Take 1 capsule (5 mg total) by mouth daily as needed. For nerves      digoxin 0.125 MG tablet   Commonly known as: LANOXIN   Take 1 tablet (125  mcg total) by mouth daily.      diltiazem 300 MG 24 hr capsule   Commonly known as: CARDIZEM CD   Take 1 capsule (300 mg total) by mouth daily.      dorzolamide 2 % ophthalmic solution   Commonly known as: TRUSOPT   Place 1 drop into both eyes 2 (two) times daily.      furosemide 40 MG tablet   Commonly known as: LASIX   Take 1 tablet (40 mg total) by mouth daily.      guaiFENesin-dextromethorphan 100-10 MG/5ML syrup   Commonly known as: ROBITUSSIN DM   Take 5 mLs by mouth every 4 (four) hours as needed for cough.      isosorbide mononitrate 60 MG 24 hr tablet   Commonly known as: IMDUR   Take 1 tablet (60 mg total) by mouth daily.      latanoprost 0.005 % ophthalmic solution   Commonly known as: XALATAN   Place 1 drop into both eyes at bedtime.      levofloxacin 750 MG tablet   Commonly known as: LEVAQUIN   Take 1 tablet (750 mg total) by mouth every other day.      levothyroxine 100 MCG tablet   Commonly known as: SYNTHROID, LEVOTHROID   Take 1 tablet (100 mcg total) by mouth daily.      metoprolol succinate 100 MG 24 hr tablet   Commonly known as: TOPROL-XL   Take 50 mg by mouth 2 (two) times daily. Take with or immediately following a meal.      nitroGLYCERIN 0.4 MG SL tablet   Commonly known as: NITROSTAT   Place 0.4 mg under the tongue every 5 (five) minutes as needed. For chest pains      potassium chloride SA 20 MEQ tablet   Commonly known as: K-DUR,KLOR-CON   Take 1 tablet (20 mEq total) by mouth every other day.      warfarin 5 MG tablet   Commonly known as: COUMADIN   Take 1/2 tab 10/23 and 10/24 then resume whole tab daily           Follow-up Information    Follow up with Cassell Clement, MD. On 01/12/2012. (appointment at 11:30. No driving until Dr. Patty Sermons sees)    Contact information:   [address] [place] [address] [phone]           The results of significant diagnostics from this hospitalization (including  imaging, microbiology, ancillary and laboratory) are listed below for reference.    Significant Diagnostic Studies: Dg Chest 2 View  12/30/2011  *RADIOLOGY REPORT*  Clinical Data: Sore throat, cough  CHEST - 2 VIEW  Comparison: 02/16/2010  Findings: Cardiomegaly again noted.  Stable nodular consolidation in the right upper lobe.  There is  new airspace disease  in  right upper lobe and right perihilar region.  This is highly suspicious for asymmetric infiltrate/pneumonia rather than asymmetric pulmonary edema.  Follow-up to resolution is recommended.  Stable degenerative changes thoracic spine.  IMPRESSION: Stable nodular consolidation in the right upper lobe.  There is new airspace disease  in right upper lobe and right perihilar region.  This is highly suspicious for asymmetric infiltrate/pneumonia rather than asymmetric pulmonary edema. Follow-up to resolution is recommended.   Original Report Authenticated By: Natasha Mead, M.D.     Microbiology: Recent Results (from the past 240 hour(s))  CULTURE, BLOOD (ROUTINE X 2)     Status: Normal (Preliminary result)   Collection Time   12/30/11  3:49 PM      Component Value Range Status Comment   Specimen Description BLOOD HAND LEFT   Final    Special Requests BOTTLES DRAWN AEROBIC ONLY 2CC   Final    Culture  Setup Time 12/30/2011 21:57   Final    Culture     Final    Value:        BLOOD CULTURE RECEIVED NO GROWTH TO DATE CULTURE WILL BE HELD FOR 5 DAYS BEFORE ISSUING A FINAL NEGATIVE REPORT   Report Status PENDING   Incomplete   CULTURE, BLOOD (ROUTINE X 2)     Status: Normal (Preliminary result)   Collection Time   12/30/11  3:52 PM      Component Value Range Status Comment   Specimen Description BLOOD HAND LEFT   Final    Special Requests BOTTLES DRAWN AEROBIC ONLY 2CC   Final    Culture  Setup Time 12/30/2011 21:57   Final    Culture     Final    Value:        BLOOD CULTURE RECEIVED NO GROWTH TO DATE CULTURE WILL BE HELD FOR 5 DAYS BEFORE  ISSUING A FINAL NEGATIVE REPORT   Report Status PENDING   Incomplete   MRSA PCR SCREENING     Status: Abnormal   Collection Time   12/30/11  6:29 PM      Component Value Range Status Comment   MRSA by PCR POSITIVE (*) NEGATIVE Final      Labs: Basic Metabolic Panel:  Lab 12/31/11 1610 12/31/11 0520 12/30/11 0855  NA 134* 137 139  K 4.0 6.0* 3.7  CL 103 105 102  CO2 21 22 25   GLUCOSE 108* 101* 214*  BUN 19 17 14   CREATININE 0.92 0.74 0.84  CALCIUM 8.4 8.3* 9.0  MG -- -- --  PHOS -- -- --   Liver Function Tests: No results found for this basename: AST:5,ALT:5,ALKPHOS:5,BILITOT:5,PROT:5,ALBUMIN:5 in the last 168 hours No results found for this basename: LIPASE:5,AMYLASE:5 in the last 168 hours No results found for this basename: AMMONIA:5 in the last 168 hours CBC:  Lab 12/31/11 0520 12/30/11 0855  WBC 9.8 13.5*  NEUTROABS -- --  HGB 10.3* 12.7  HCT 30.0* 37.8  MCV 86.2 86.5  PLT 397 252   Cardiac Enzymes: No results found for this basename: CKTOTAL:5,CKMB:5,CKMBINDEX:5,TROPONINI:5 in the last 168 hours BNP: BNP (last 3 results) No results found for this basename: PROBNP:3 in the last 8760 hours CBG:  Lab 12/30/11 1338  GLUCAP 130*       Signed:  Gwenyth Bender NP [facility] 01/02/2012, 11:17 AM

## 2012-01-02 NOTE — Progress Notes (Signed)
Discussed discharge instructions with pt including how and when to call the dr, follow up appts, medications to take at home, diet and activity. Pt verbalized understanding and denied any questions. Prescriptions given. IV dc'd.

## 2012-01-02 NOTE — Discharge Summary (Signed)
Patient seen and examined by me.  Agree with d/c. Patient feeling better.  Needs to follow up with PCP.  Marlin Canary DO

## 2012-01-02 NOTE — Progress Notes (Signed)
ANTICOAGULATION CONSULT NOTE - Follow Up Consult  Pharmacy Consult for Coumadin Indication: atrial fibrillation  Labs:  Basename 01/02/12 0646 01/01/12 0445 12/31/11 1517 12/31/11 1100 12/31/11 0520  HGB -- -- -- -- 10.3*  HCT -- -- -- -- 30.0*  PLT -- -- -- -- 397  APTT -- -- -- -- --  LABPROT 25.1* 32.3* 36.7* -- --  INR 2.41* 3.38* 4.02* -- --  HEPARINUNFRC -- -- -- -- --  CREATININE -- -- -- 0.92 0.74  CKTOTAL -- -- -- -- --  CKMB -- -- -- -- --  TROPONINI -- -- -- -- --    Estimated Creatinine Clearance: 27.4 ml/min (by C-G formula based on Cr of 0.92).  Assessment: 78 yo female on chronic coumadin for Afib.  No bleeding noted. INR=2.41  Home dose: 5 mg every other day  Goal of Therapy:  INR 2-3   Plan:  -Coumadin 2.5 mg po x 1  -Home with 2.5 mg daily instead of 5 mg every other day -INR daily  Okey Regal, PharmD [phone]  01/02/2012 9:14 AM

## 2012-01-05 LAB — CULTURE, BLOOD (ROUTINE X 2): Culture: NO GROWTH

## 2012-01-12 ENCOUNTER — Ambulatory Visit
Admission: RE | Admit: 2012-01-12 | Discharge: 2012-01-12 | Disposition: A | Discharge status: Home / Self Care | Payer: Medicare Other | Source: Ambulatory Visit | Attending: Nurse Practitioner | Admitting: Nurse Practitioner

## 2012-01-12 ENCOUNTER — Ambulatory Visit (INDEPENDENT_AMBULATORY_CARE_PROVIDER_SITE_OTHER): Payer: Medicare Other

## 2012-01-12 ENCOUNTER — Encounter: Payer: Self-pay | Admitting: Nurse Practitioner

## 2012-01-12 ENCOUNTER — Ambulatory Visit (INDEPENDENT_AMBULATORY_CARE_PROVIDER_SITE_OTHER): Payer: Medicare Other | Admitting: Nurse Practitioner

## 2012-01-12 VITALS — BP 128/56 | HR 60 | Ht 62.0 in | Wt 102.1 lb

## 2012-01-12 DIAGNOSIS — J189 Pneumonia, unspecified organism: Secondary | ICD-10-CM

## 2012-01-12 DIAGNOSIS — I4891 Unspecified atrial fibrillation: Secondary | ICD-10-CM

## 2012-01-12 DIAGNOSIS — Z79899 Other long term (current) drug therapy: Secondary | ICD-10-CM

## 2012-01-12 LAB — POCT INR: INR: 2.2

## 2012-01-12 NOTE — Patient Instructions (Addendum)
Stay on your current medicines  We will get your coumadin checked today  I want you to go and get a chest Xray. Go to [facility] to [facility] on the 1st floor.  We will see you back later this month  Call the [facility] office at [phone] if you have any questions, problems or concerns.

## 2012-01-12 NOTE — Progress Notes (Signed)
[name] Date of Birth: 20-Dec-1914 Medical Record #[id number]  History of Present Illness: [name] is seen back today for a post hospital visit. She is seen for Dr. Patty Sermons. She has chronic atrial fib, on coumadin, HTN and no known ischemic heart disease. Negative stress test back in 2008. Echo in 2009 showed an EF of 60 to 65% with mild AS and moderate pulmonary HTN and moderate MR.   She was most recently admitted with increased heart rate with her atrial fib. This was in the setting of chills and fatigue. CXR showed a new airspace disease in the right upper lobe and right perihilar region and was suspicious for asymmetric infiltrate/pneumonia. She was treated with antibiotics, nebs and O2 support.  She comes in today. She is doing well. Feeling so much better. Less cough. No fever or chills. No palpitations. Says she is back to her baseline. Off of her antibiotics. Needs a follow up CXR and an INR today.   Current Outpatient Prescriptions on File Prior to Visit  Medication Sig Dispense Refill  . aspirin 81 MG tablet Take 81 mg by mouth daily.        . chlordiazePOXIDE (LIBRIUM) 5 MG capsule Take 1 capsule (5 mg total) by mouth daily as needed. For nerves  30 capsule  5  . digoxin (LANOXIN) 0.125 MG tablet Take 1 tablet (125 mcg total) by mouth daily.  30 tablet  11  . diltiazem (CARDIZEM CD) 300 MG 24 hr capsule Take 1 capsule (300 mg total) by mouth daily.  30 capsule  11  . dorzolamide (TRUSOPT) 2 % ophthalmic solution Place 1 drop into both eyes 2 (two) times daily.       . furosemide (LASIX) 40 MG tablet Take 1 tablet (40 mg total) by mouth daily.  30 tablet  8  . guaiFENesin-dextromethorphan (ROBITUSSIN DM) 100-10 MG/5ML syrup Take 5 mLs by mouth every 4 (four) hours as needed for cough.  118 mL  0  . isosorbide mononitrate (IMDUR) 60 MG 24 hr tablet Take 1 tablet (60 mg total) by mouth daily.  30 tablet  11  . latanoprost (XALATAN) 0.005 % ophthalmic solution Place 1 drop into  both eyes at bedtime.       Marland Kitchen levothyroxine (SYNTHROID, LEVOTHROID) 100 MCG tablet Take 1 tablet (100 mcg total) by mouth daily.  31 tablet  11  . metoprolol succinate (TOPROL-XL) 100 MG 24 hr tablet Take 50 mg by mouth 2 (two) times daily. Take with or immediately following a meal.      . nitroGLYCERIN (NITROSTAT) 0.4 MG SL tablet Place 0.4 mg under the tongue every 5 (five) minutes as needed. For chest pains       . potassium chloride SA (K-DUR,KLOR-CON) 20 MEQ tablet Take 1 tablet (20 mEq total) by mouth every other day.  90 tablet  3  . warfarin (COUMADIN) 5 MG tablet Take 1/2 tab 10/23 and 10/24 then resume whole tab daily  2 tablet  0    Allergies  Allergen Reactions  . Famotidine   . Penicillins   . Sulfa Antibiotics   . Tetracyclines & Related     Past Medical History  Diagnosis Date  . Atrial fibrillation with controlled ventricular response   . Hypothyroidism   . Hypertension   . Diastolic CHF, acute on chronic   . Valvular heart disease     with severe mr,tr,and mod pulmonary regurgitation  . Breast cancer     Past Surgical History  Procedure Date  . Mastectomy   . Oopherectomy   . Appendectomy   . Cholecystectomy     History  Smoking status  . Never Smoker   Smokeless tobacco  . Not on file    History  Alcohol Use No    History reviewed. No pertinent family history.  Review of Systems: The review of systems is per the HPI.  All other systems were reviewed and are negative.  Physical Exam: BP 128/56  Pulse 60  Ht 5\' 2"  (1.575 m)  Wt 102 lb 1.9 oz (46.321 kg)  BMI 18.68 kg/m2 Patient is very pleasant and in no acute distress. She looks younger than her stated age. Weight is just down a pound. Skin is warm and dry. Color is normal.  HEENT is unremarkable. Normocephalic/atraumatic. PERRL. Sclera are nonicteric. Neck is supple. No masses. No JVD. Lungs are clear. Cardiac exam shows an irregular rhythm. Her rate is ok. Abdomen is soft. Extremities are  without edema. Gait and ROM are intact. No gross neurologic deficits noted.  LABORATORY DATA: CXR and INR pending  Lab Results  Component Value Date   WBC 9.8 12/31/2011   HGB 10.3* 12/31/2011   HCT 30.0* 12/31/2011   PLT 397 12/31/2011   GLUCOSE 108* 12/31/2011   CHOL  Value: 124        ATP III CLASSIFICATION:  <200     mg/dL   Desirable  562-130  mg/dL   Borderline High  >=865    mg/dL   High        09/18/4694   TRIG 70 10/19/2008   HDL 51 10/19/2008   LDLCALC  Value: 59        Total Cholesterol/HDL:CHD Risk Coronary Heart Disease Risk Table                     Men   Women  1/2 Average Risk   3.4   3.3  Average Risk       5.0   4.4  2 X Average Risk   9.6   7.1  3 X Average Risk  23.4   11.0        Use the calculated Patient Ratio above and the CHD Risk Table to determine the patient's CHD Risk.        ATP III CLASSIFICATION (LDL):  <100     mg/dL   Optimal  295-284  mg/dL   Near or Above                    Optimal  130-159  mg/dL   Borderline  132-440  mg/dL   High  >102     mg/dL   Very High 09/11/5364   ALT 19 10/25/2010   AST 29 10/25/2010   NA 134* 12/31/2011   K 4.0 12/31/2011   CL 103 12/31/2011   CREATININE 0.92 12/31/2011   BUN 19 12/31/2011   CO2 21 12/31/2011   TSH 4.609* 02/14/2010   INR 2.41* 01/02/2012   Dg Chest 2 View  12/30/2011  *RADIOLOGY REPORT*  Clinical Data: Sore throat, cough  CHEST - 2 VIEW  Comparison: 02/16/2010  Findings: Cardiomegaly again noted.  Stable nodular consolidation in the right upper lobe.  There is  new airspace disease  in  right upper lobe and right perihilar region.  This is highly suspicious for asymmetric infiltrate/pneumonia rather than asymmetric pulmonary edema.  Follow-up to resolution is recommended.  Stable degenerative changes thoracic spine.  IMPRESSION: Stable nodular consolidation in the right upper lobe.  There is new airspace disease  in right upper lobe and right perihilar region.  This is highly suspicious for asymmetric  infiltrate/pneumonia rather than asymmetric pulmonary edema. Follow-up to resolution is recommended.   Original Report Authenticated By: Natasha Mead, M.D.     Assessment / Plan: 1. Recent pneumonia - seems much better clinically. Will send her for a CXR to see if we have resolution.   2. Chronic atrial fib - rate is ok. On coumadin. Will check an INR today.   3. Anemia - would recheck on her return visit.   She is doing ok. We will plan on repeat labs at her next visit here in 2 weeks with Dr. Patty Sermons. Check CXR and INR today. Patient is agreeable to this plan and will call if any problems develop in the interim.

## 2012-01-19 ENCOUNTER — Encounter: Payer: Self-pay | Admitting: Cardiology

## 2012-01-23 ENCOUNTER — Encounter: Payer: Self-pay | Admitting: Cardiology

## 2012-01-23 ENCOUNTER — Ambulatory Visit (INDEPENDENT_AMBULATORY_CARE_PROVIDER_SITE_OTHER): Payer: Medicare Other | Admitting: Cardiology

## 2012-01-23 ENCOUNTER — Ambulatory Visit (INDEPENDENT_AMBULATORY_CARE_PROVIDER_SITE_OTHER): Payer: Medicare Other | Admitting: *Deleted

## 2012-01-23 VITALS — BP 123/60 | HR 66 | Wt 123.0 lb

## 2012-01-23 DIAGNOSIS — R0989 Other specified symptoms and signs involving the circulatory and respiratory systems: Secondary | ICD-10-CM

## 2012-01-23 DIAGNOSIS — I4891 Unspecified atrial fibrillation: Secondary | ICD-10-CM

## 2012-01-23 DIAGNOSIS — I119 Hypertensive heart disease without heart failure: Secondary | ICD-10-CM

## 2012-01-23 DIAGNOSIS — J189 Pneumonia, unspecified organism: Secondary | ICD-10-CM

## 2012-01-23 DIAGNOSIS — Z79899 Other long term (current) drug therapy: Secondary | ICD-10-CM

## 2012-01-23 DIAGNOSIS — I10 Essential (primary) hypertension: Secondary | ICD-10-CM

## 2012-01-23 LAB — POCT INR: INR: 2.2

## 2012-01-23 NOTE — Assessment & Plan Note (Signed)
The patient continues in chronic atrial fibrillation with a controlled ventricular response.  She is on digoxin, beta blocker, and diltiazem.  She has not had any TIA symptoms.  She is not having any bleeding problems from the Coumadin

## 2012-01-23 NOTE — Patient Instructions (Addendum)
Your physician recommends that you continue on your current medications as directed. Please refer to the Current Medication list given to you today.  Your physician recommends that you schedule a follow-up appointment in: 4 months ov/cbc/bmet/ekg

## 2012-01-23 NOTE — Progress Notes (Signed)
[name] Date of Birth:  29-Sep-1914 [facility] [address] [place], [place]  [address] [phone]  Fax   [phone]  HPI: This pleasant 78 year old woman is seen for a scheduled 3 month followup office visit. She has a history of atrial fibrillation with controlled ventricular response. He also has a history of essential hypertension. She has had some unintentional weight loss. He does not have any history of ischemic heart disease and she had a normal adenosine Cardiolite stress test in 2008. She had an echocardiogram in 2009 showing an ejection fraction of 60-65% with mild aortic stenosis and moderate pulmonary hypertension and moderate mitral regurgitation.   Current Outpatient Prescriptions  Medication Sig Dispense Refill  . aspirin 81 MG tablet Take 81 mg by mouth daily.        . chlordiazePOXIDE (LIBRIUM) 5 MG capsule Take 1 capsule (5 mg total) by mouth daily as needed. For nerves  30 capsule  5  . digoxin (LANOXIN) 0.125 MG tablet Take 1 tablet (125 mcg total) by mouth daily.  30 tablet  11  . diltiazem (CARDIZEM CD) 300 MG 24 hr capsule Take 1 capsule (300 mg total) by mouth daily.  30 capsule  11  . dorzolamide (TRUSOPT) 2 % ophthalmic solution Place 1 drop into both eyes 2 (two) times daily.       . furosemide (LASIX) 40 MG tablet Take 1 tablet (40 mg total) by mouth daily.  30 tablet  8  . guaiFENesin-dextromethorphan (ROBITUSSIN DM) 100-10 MG/5ML syrup Take 5 mLs by mouth every 4 (four) hours as needed for cough.  118 mL  0  . isosorbide mononitrate (IMDUR) 60 MG 24 hr tablet Take 1 tablet (60 mg total) by mouth daily.  30 tablet  11  . latanoprost (XALATAN) 0.005 % ophthalmic solution Place 1 drop into both eyes at bedtime.       Marland Kitchen levothyroxine (SYNTHROID, LEVOTHROID) 100 MCG tablet Take 1 tablet (100 mcg total) by mouth daily.  31 tablet  11  . metoprolol succinate (TOPROL-XL) 100 MG 24 hr tablet Take 50 mg by mouth 2 (two) times daily. Take  with or immediately following a meal.      . nitroGLYCERIN (NITROSTAT) 0.4 MG SL tablet Place 0.4 mg under the tongue every 5 (five) minutes as needed. For chest pains       . potassium chloride SA (K-DUR,KLOR-CON) 20 MEQ tablet Take 1 tablet (20 mEq total) by mouth every other day.  90 tablet  3  . warfarin (COUMADIN) 5 MG tablet Take 1/2 tab 10/23 and 10/24 then resume whole tab daily  2 tablet  0    Allergies  Allergen Reactions  . Famotidine   . Penicillins   . Sulfa Antibiotics   . Tetracyclines & Related     Patient Active Problem List  Diagnosis  . Atrial fibrillation with controlled ventricular response  . Hypothyroidism  . Hypertension  . Weight loss, non-intentional  . Onychomycosis of toenail  . Community acquired pneumonia  . Atrial fibrillation with RVR  . Cough  . Sore throat  . Leukocytosis    History  Smoking status  . Never Smoker   Smokeless tobacco  . Not on file    History  Alcohol Use No    No family history on file.  Review of Systems: The patient denies any heat or cold intolerance.  No weight gain or weight loss.  The patient denies headaches or blurry vision.  There  is no cough or sputum production.  The patient denies dizziness.  There is no hematuria or hematochezia.  The patient denies any muscle aches or arthritis.  The patient denies any rash.  The patient denies frequent falling or instability.  There is no history of depression or anxiety.  All other systems were reviewed and are negative.   Physical Exam: Filed Vitals:   01/23/12 1039  BP: 123/60  Pulse: 66   general appearance reveals a well-developed elderly woman who is alert.  She is in no distress.The head and neck exam reveals pupils equal and reactive.  Extraocular movements are full.  There is no scleral icterus.  The mouth and pharynx are normal.  The neck is supple.  The carotids reveal no bruits.  The jugular venous pressure is normal.  The  thyroid is not enlarged.  There  is no lymphadenopathy.  The chest is clear to percussion and auscultation.  There are no rales or rhonchi.  Expansion of the chest is symmetrical.  The right breast is absent.  The rhythm is irregularly irregular  The precordium is quiet.  The first heart sound is normal.  The second heart sound is physiologically split.  There is no murmur gallop rub or click.  There is no abnormal lift or heave.  The abdomen is soft and nontender.  The bowel sounds are normal.  The liver and spleen are not enlarged.  There are no abdominal masses.  There are no abdominal bruits.  Extremities reveal good pedal pulses.  She has chronic lymphedema of the right arm There is no phlebitis or edema.  There is no cyanosis or clubbing.  Strength is normal and symmetrical in all extremities.  There is no lateralizing weakness.  There are no sensory deficits.  The skin is warm and dry.  There is no rash.      Assessment / Plan: The patient is to continue same medication.  Should be rechecked in 4 months for followup office visit EKG CBC and basal metabolic panel.

## 2012-01-23 NOTE — Assessment & Plan Note (Signed)
The patient was hospitalized in mid October with community-acquired pneumonia.  She has medical recovery.  She is not coughing.  Her strength is back to normal.  She is now strong enough to drive her car locally again

## 2012-01-23 NOTE — Assessment & Plan Note (Signed)
Blood pressure is stable on current therapy.  She is not having dizzy spells or syncope she denies any chest pain.

## 2012-01-25 ENCOUNTER — Encounter: Payer: Self-pay | Admitting: Cardiology

## 2012-02-20 ENCOUNTER — Ambulatory Visit (INDEPENDENT_AMBULATORY_CARE_PROVIDER_SITE_OTHER): Payer: Medicare Other | Admitting: *Deleted

## 2012-02-20 DIAGNOSIS — I4891 Unspecified atrial fibrillation: Secondary | ICD-10-CM

## 2012-02-20 DIAGNOSIS — Z79899 Other long term (current) drug therapy: Secondary | ICD-10-CM

## 2012-02-20 LAB — POCT INR: INR: 2.2

## 2012-03-19 ENCOUNTER — Ambulatory Visit (INDEPENDENT_AMBULATORY_CARE_PROVIDER_SITE_OTHER): Payer: Medicare Other

## 2012-03-19 DIAGNOSIS — Z79899 Other long term (current) drug therapy: Secondary | ICD-10-CM

## 2012-03-19 DIAGNOSIS — I4891 Unspecified atrial fibrillation: Secondary | ICD-10-CM

## 2012-03-19 LAB — POCT INR: INR: 1.9

## 2012-04-09 ENCOUNTER — Other Ambulatory Visit: Payer: Self-pay

## 2012-04-09 DIAGNOSIS — F419 Anxiety disorder, unspecified: Secondary | ICD-10-CM

## 2012-04-09 MED ORDER — CHLORDIAZEPOXIDE HCL 5 MG PO CAPS: 5.0000 mg | ORAL_CAPSULE | Freq: Every day | ORAL | Status: DC | PRN

## 2012-04-10 ENCOUNTER — Other Ambulatory Visit: Payer: Self-pay | Admitting: Cardiology

## 2012-04-10 DIAGNOSIS — F419 Anxiety disorder, unspecified: Secondary | ICD-10-CM

## 2012-04-10 NOTE — Telephone Encounter (Signed)
Pt needs refill of chlordiazePOXIDE AT [facility]

## 2012-04-11 ENCOUNTER — Telehealth: Payer: Self-pay | Admitting: Cardiology

## 2012-04-11 NOTE — Telephone Encounter (Signed)
New Problem:    Patient called in needing a prescription stating mastectomy supplies with today's date.  Please [phone].  Medicare requires proper documentation and patient still needs these supplies.

## 2012-04-15 ENCOUNTER — Other Ambulatory Visit: Payer: Self-pay

## 2012-04-15 ENCOUNTER — Ambulatory Visit (INDEPENDENT_AMBULATORY_CARE_PROVIDER_SITE_OTHER): Payer: Medicare Other | Admitting: *Deleted

## 2012-04-15 ENCOUNTER — Telehealth: Payer: Self-pay | Admitting: Cardiology

## 2012-04-15 DIAGNOSIS — I4891 Unspecified atrial fibrillation: Secondary | ICD-10-CM

## 2012-04-15 DIAGNOSIS — Z79899 Other long term (current) drug therapy: Secondary | ICD-10-CM

## 2012-04-15 NOTE — Telephone Encounter (Signed)
Pt needs order for medical supplies dated 04-11-12 @ [phone] , I cant understand exactly what she needs, very confusing, pls call [phone]

## 2012-04-15 NOTE — Telephone Encounter (Signed)
New Problem:    Patient called in wanting to speak with you about the wrong date on her prescription.  Please call back.

## 2012-04-15 NOTE — Telephone Encounter (Signed)
Faxed as requested

## 2012-04-15 NOTE — Telephone Encounter (Signed)
Will resend with 04/11/12 date

## 2012-04-16 MED ORDER — CHLORDIAZEPOXIDE HCL 5 MG PO CAPS: 5.0000 mg | ORAL_CAPSULE | Freq: Every day | ORAL | Status: DC | PRN

## 2012-04-30 ENCOUNTER — Other Ambulatory Visit: Payer: Self-pay | Admitting: *Deleted

## 2012-05-02 ENCOUNTER — Other Ambulatory Visit: Payer: Self-pay | Admitting: *Deleted

## 2012-05-02 MED ORDER — METOPROLOL SUCCINATE ER 100 MG PO TB24: ORAL_TABLET | ORAL | Status: DC

## 2012-05-09 ENCOUNTER — Telehealth: Payer: Self-pay | Admitting: *Deleted

## 2012-05-09 ENCOUNTER — Other Ambulatory Visit: Payer: Self-pay | Admitting: *Deleted

## 2012-05-09 MED ORDER — CHLORDIAZEPOXIDE HCL 5 MG PO CAPS: 5.0000 mg | ORAL_CAPSULE | Freq: Every day | ORAL | Status: DC | PRN

## 2012-05-09 NOTE — Telephone Encounter (Signed)
Okay to fill the chlordiazepoxide.

## 2012-05-09 NOTE — Telephone Encounter (Signed)
Pharmacy calling b/c they never received refill for chlordiazepoxide. The chart says it was called in By Dr Patty Sermons 04/11/12 but pharmacy states it is not in the system. I let pharmacy know this medication will have to be authorized by Dr Patty Sermons himself and i will forward this message to him and his nurse. She said Thank You and verbally agreed.   Micki Riley, CMA

## 2012-05-09 NOTE — Telephone Encounter (Signed)
Advised patient

## 2012-05-09 NOTE — Telephone Encounter (Signed)
It is generic.  She may need to pay for it out of pocket if insurance will not cover it

## 2012-05-09 NOTE — Telephone Encounter (Signed)
Pharmacy calling back. Refill was initially in system at pharmacy, Insurance will not cover the medication and asked if there is another formulary that can be prescribed.   Micki Riley , CMA

## 2012-05-21 ENCOUNTER — Other Ambulatory Visit: Payer: Self-pay | Admitting: Emergency Medicine

## 2012-05-21 MED ORDER — FUROSEMIDE 40 MG PO TABS: 40.0000 mg | ORAL_TABLET | Freq: Every day | ORAL | Status: DC

## 2012-05-22 ENCOUNTER — Ambulatory Visit: Payer: Medicare Other | Admitting: Cardiology

## 2012-05-22 ENCOUNTER — Other Ambulatory Visit: Payer: Medicare Other

## 2012-05-23 ENCOUNTER — Encounter: Payer: Self-pay | Admitting: Cardiology

## 2012-05-23 ENCOUNTER — Ambulatory Visit (INDEPENDENT_AMBULATORY_CARE_PROVIDER_SITE_OTHER): Payer: Medicare Other

## 2012-05-23 ENCOUNTER — Ambulatory Visit (INDEPENDENT_AMBULATORY_CARE_PROVIDER_SITE_OTHER): Payer: Medicare Other | Admitting: Cardiology

## 2012-05-23 ENCOUNTER — Telehealth: Payer: Self-pay | Admitting: Cardiology

## 2012-05-23 ENCOUNTER — Other Ambulatory Visit (INDEPENDENT_AMBULATORY_CARE_PROVIDER_SITE_OTHER): Payer: Medicare Other

## 2012-05-23 ENCOUNTER — Telehealth: Payer: Self-pay | Admitting: *Deleted

## 2012-05-23 VITALS — BP 128/70 | HR 66 | Ht 62.0 in | Wt 107.0 lb

## 2012-05-23 DIAGNOSIS — I4891 Unspecified atrial fibrillation: Secondary | ICD-10-CM

## 2012-05-23 DIAGNOSIS — I10 Essential (primary) hypertension: Secondary | ICD-10-CM

## 2012-05-23 DIAGNOSIS — I119 Hypertensive heart disease without heart failure: Secondary | ICD-10-CM

## 2012-05-23 DIAGNOSIS — E039 Hypothyroidism, unspecified: Secondary | ICD-10-CM

## 2012-05-23 DIAGNOSIS — Z79899 Other long term (current) drug therapy: Secondary | ICD-10-CM

## 2012-05-23 LAB — CBC WITH DIFFERENTIAL/PLATELET
Basophils Relative: 0.3 % (ref 0.0–3.0)
Eosinophils Absolute: 0 10*3/uL (ref 0.0–0.7)
Eosinophils Relative: 0 % (ref 0.0–5.0)
Hemoglobin: 11 g/dL — ABNORMAL LOW (ref 12.0–15.0)
Lymphocytes Relative: 11.7 % — ABNORMAL LOW (ref 12.0–46.0)
MCHC: 33 g/dL (ref 30.0–36.0)
Monocytes Relative: 9.3 % (ref 3.0–12.0)
Neutro Abs: 6.9 10*3/uL (ref 1.4–7.7)
RBC: 3.88 Mil/uL (ref 3.87–5.11)
WBC: 8.8 10*3/uL (ref 4.5–10.5)

## 2012-05-23 LAB — BASIC METABOLIC PANEL
CO2: 26 mEq/L (ref 19–32)
Calcium: 9 mg/dL (ref 8.4–10.5)
Sodium: 138 mEq/L (ref 135–145)

## 2012-05-23 LAB — POCT INR: INR: 1.9

## 2012-05-23 NOTE — Assessment & Plan Note (Signed)
Blood pressure was remaining stable on current therapy.  No dizziness or syncope. 

## 2012-05-23 NOTE — Patient Instructions (Addendum)
Will obtain labs today and call you with the results (cbc/bmet)  Your physician recommends that you continue on your current medications as directed. Please refer to the Current Medication list given to you today.  Your physician wants you to follow-up in: 4 month ovYou will receive a reminder letter in the mail two months in advance. If you don't receive a letter, please call our office to schedule the follow-up appointment.

## 2012-05-23 NOTE — Assessment & Plan Note (Signed)
The patient has permanent atrial fibrillation with controlled ventricular response.  She has not been having any TIA symptoms.  She is on long-term Coumadin.

## 2012-05-23 NOTE — Assessment & Plan Note (Signed)
Her previous weight loss has stabilized and she has actually gained a few pounds since last visit.  Appetite is satisfactory.Marland Kitchen

## 2012-05-23 NOTE — Assessment & Plan Note (Signed)
The patient is clinically euthyroid on Synthroid replacement 100 mcg daily

## 2012-05-23 NOTE — Telephone Encounter (Signed)
New problem    Son calling need to discuss his mother care.
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
## 2012-05-23 NOTE — Telephone Encounter (Signed)
Advised son no with mother after visit

## 2012-05-23 NOTE — Telephone Encounter (Signed)
Message copied by Burnell Blanks on Thu May 23, 2012  5:57 PM ------      Message from: Cassell Clement      Created: Thu May 23, 2012  4:03 PM       CBC is stable.  Hgb is better. Kidneys are dry.  Drink more water. ------

## 2012-05-23 NOTE — Telephone Encounter (Signed)
Advised patient of lab results  

## 2012-05-27 ENCOUNTER — Telehealth: Payer: Self-pay | Admitting: Cardiology

## 2012-05-27 MED ORDER — DIGOXIN 125 MCG PO TABS: 125.0000 ug | ORAL_TABLET | Freq: Every day | ORAL | Status: DC

## 2012-05-27 MED ORDER — DILTIAZEM HCL ER COATED BEADS 300 MG PO CP24: 300.0000 mg | ORAL_CAPSULE | Freq: Every day | ORAL | Status: DC

## 2012-05-27 MED ORDER — FUROSEMIDE 40 MG PO TABS: 40.0000 mg | ORAL_TABLET | Freq: Every day | ORAL | Status: DC

## 2012-05-27 MED ORDER — METOPROLOL SUCCINATE ER 100 MG PO TB24: ORAL_TABLET | ORAL | Status: DC

## 2012-05-27 MED ORDER — LEVOTHYROXINE SODIUM 100 MCG PO TABS: 100.0000 ug | ORAL_TABLET | Freq: Every day | ORAL | Status: DC

## 2012-05-27 MED ORDER — POTASSIUM CHLORIDE CRYS ER 20 MEQ PO TBCR: 20.0000 meq | EXTENDED_RELEASE_TABLET | ORAL | Status: DC

## 2012-05-27 MED ORDER — ISOSORBIDE MONONITRATE ER 60 MG PO TB24: 60.0000 mg | ORAL_TABLET | Freq: Every day | ORAL | Status: DC

## 2012-05-27 NOTE — Telephone Encounter (Signed)
New problem   Pt would like to know if she can get her meds for 90 days instead of 30days for these meds- Metoprolol Succinte 100mg / Isosordidemononitra 60mg / Potassium Chloride 20mg / Digoxin .125mg / Diltiazemhcler 300mg / Levothyroxine Sodium 126mcg/ Furosemide 40mg . [facility] phone # [phone].

## 2012-06-20 ENCOUNTER — Ambulatory Visit (INDEPENDENT_AMBULATORY_CARE_PROVIDER_SITE_OTHER): Payer: Medicare Other | Admitting: *Deleted

## 2012-06-20 DIAGNOSIS — I4891 Unspecified atrial fibrillation: Secondary | ICD-10-CM

## 2012-06-20 DIAGNOSIS — Z79899 Other long term (current) drug therapy: Secondary | ICD-10-CM

## 2012-06-20 DIAGNOSIS — Z7901 Long term (current) use of anticoagulants: Secondary | ICD-10-CM

## 2012-06-26 LAB — PROTIME-INR: INR: 3.8 — AB (ref 0.9–1.1)

## 2012-07-01 ENCOUNTER — Other Ambulatory Visit: Payer: Self-pay | Admitting: *Deleted

## 2012-07-01 ENCOUNTER — Encounter: Payer: Self-pay | Admitting: *Deleted

## 2012-07-01 MED ORDER — WARFARIN SODIUM 5 MG PO TABS: ORAL_TABLET | ORAL | Status: DC

## 2012-07-04 ENCOUNTER — Ambulatory Visit (INDEPENDENT_AMBULATORY_CARE_PROVIDER_SITE_OTHER): Payer: Medicare Other | Admitting: *Deleted

## 2012-07-04 DIAGNOSIS — Z79899 Other long term (current) drug therapy: Secondary | ICD-10-CM

## 2012-07-04 DIAGNOSIS — I4891 Unspecified atrial fibrillation: Secondary | ICD-10-CM

## 2012-07-04 NOTE — Patient Instructions (Signed)
Primary care MD: Windle Guard MD [facility] [address] [place], [place] [address] [phone]

## 2012-07-09 ENCOUNTER — Ambulatory Visit: Payer: Self-pay | Admitting: Cardiovascular Disease

## 2012-07-09 DIAGNOSIS — I4891 Unspecified atrial fibrillation: Secondary | ICD-10-CM

## 2012-07-19 ENCOUNTER — Encounter: Payer: Self-pay | Admitting: Cardiology

## 2012-07-19 ENCOUNTER — Ambulatory Visit (INDEPENDENT_AMBULATORY_CARE_PROVIDER_SITE_OTHER): Payer: Medicare Other | Admitting: Cardiology

## 2012-07-19 VITALS — BP 118/68 | HR 62 | Ht 62.0 in | Wt 106.4 lb

## 2012-07-19 DIAGNOSIS — I4891 Unspecified atrial fibrillation: Secondary | ICD-10-CM

## 2012-07-19 DIAGNOSIS — E039 Hypothyroidism, unspecified: Secondary | ICD-10-CM

## 2012-07-19 DIAGNOSIS — R5381 Other malaise: Secondary | ICD-10-CM | POA: Insufficient documentation

## 2012-07-19 DIAGNOSIS — D649 Anemia, unspecified: Secondary | ICD-10-CM

## 2012-07-19 DIAGNOSIS — R634 Abnormal weight loss: Secondary | ICD-10-CM

## 2012-07-19 DIAGNOSIS — R5383 Other fatigue: Secondary | ICD-10-CM

## 2012-07-19 LAB — HEPATIC FUNCTION PANEL
Albumin: 3.3 g/dL — ABNORMAL LOW (ref 3.5–5.2)
Alkaline Phosphatase: 92 U/L (ref 39–117)
Total Protein: 6.4 g/dL (ref 6.0–8.3)

## 2012-07-19 LAB — CBC WITH DIFFERENTIAL/PLATELET
Basophils Relative: 0.4 % (ref 0.0–3.0)
Eosinophils Relative: 0.3 % (ref 0.0–5.0)
HCT: 32.8 % — ABNORMAL LOW (ref 36.0–46.0)
Hemoglobin: 11 g/dL — ABNORMAL LOW (ref 12.0–15.0)
Lymphs Abs: 1.2 10*3/uL (ref 0.7–4.0)
MCV: 82.4 fl (ref 78.0–100.0)
Monocytes Absolute: 1 10*3/uL (ref 0.1–1.0)
Monocytes Relative: 12.3 % — ABNORMAL HIGH (ref 3.0–12.0)
Neutro Abs: 6.1 10*3/uL (ref 1.4–7.7)
WBC: 8.4 10*3/uL (ref 4.5–10.5)

## 2012-07-19 LAB — BASIC METABOLIC PANEL
BUN: 17 mg/dL (ref 6–23)
CO2: 26 mEq/L (ref 19–32)
Calcium: 8.7 mg/dL (ref 8.4–10.5)
Creatinine, Ser: 1 mg/dL (ref 0.4–1.2)
GFR: 56.65 mL/min — ABNORMAL LOW (ref >60.00)
Glucose, Bld: 106 mg/dL — ABNORMAL HIGH (ref 70–99)

## 2012-07-19 LAB — TSH: TSH: 0.45 u[IU]/mL (ref 0.35–5.50)

## 2012-07-19 NOTE — Progress Notes (Signed)
Alene Mires Date of Birth:  [birth year] [facility] [address] [place], [place]  [address] [phone]         Fax   [phone]  History of Present Illness: This pleasant 79 year old woman is seen for a work in office visit.  She has not been feeling as well as usual.  She has had decreased energy.  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 She has a history of atrial fibrillation with controlled ventricular response. He also has a history of essential hypertension. She has had some previous unintentional weight loss. He does not have any history of ischemic heart disease and she had a normal adenosine Cardiolite stress test in 2008. She had an echocardiogram in 2009 showing an ejection fraction of 60-65% with mild aortic stenosis and moderate pulmonary hypertension and moderate mitral regurgitation.   Current Outpatient Prescriptions  Medication Sig Dispense Refill  . aspirin 81 MG tablet Take 81 mg by mouth daily.        . chlordiazePOXIDE (LIBRIUM) 5 MG capsule Take 1 capsule (5 mg total) by mouth daily as needed. For nerves  30 capsule  5  . digoxin (LANOXIN) 0.125 MG tablet Take 1 tablet (125 mcg total) by mouth daily.  90 tablet  3  . diltiazem (CARDIZEM CD) 300 MG 24 hr capsule Take 1 capsule (300 mg total) by mouth daily.  90 capsule  3  . dorzolamide (TRUSOPT) 2 % ophthalmic solution Place 1 drop into both eyes 2 (two) times daily.       . furosemide (LASIX) 40 MG tablet Take 40 mg by mouth as directed. 1 and 1/2 tablet daily      . guaiFENesin-dextromethorphan (ROBITUSSIN DM) 100-10 MG/5ML syrup Take 5 mLs by mouth every 4 (four) hours as needed for cough.  118 mL  0  . isosorbide mononitrate (IMDUR) 60 MG 24 hr tablet Take 1 tablet (60 mg total) by mouth daily.  90 tablet  3  . latanoprost (XALATAN) 0.005 % ophthalmic solution Place 1 drop into both eyes at bedtime.       Marland Kitchen levothyroxine (SYNTHROID, LEVOTHROID) 100 MCG tablet Take 1 tablet (100 mcg total) by mouth  daily.  93 tablet  3  . LUTEIN PO Take 6-20 mg by mouth daily.      . metoprolol succinate (TOPROL-XL) 100 MG 24 hr tablet Take 1/2 tablet by mouth twice daily.  90 tablet  3  . Multiple Vitamin (MULTIVITAMIN) tablet Take 1 tablet by mouth daily.      . nitroGLYCERIN (NITROSTAT) 0.4 MG SL tablet Place 0.4 mg under the tongue every 5 (five) minutes as needed. For chest pains       . potassium chloride SA (K-DUR,KLOR-CON) 20 MEQ tablet Take 1 tablet (20 mEq total) by mouth every other day.  90 tablet  3  . vitamin C (ASCORBIC ACID) 500 MG tablet Take 500 mg by mouth daily.      . vitamin E 200 UNIT capsule Take 200 Units by mouth daily.      Marland Kitchen warfarin (COUMADIN) 5 MG tablet Take as directed by coumadin clinic  30 tablet  3   No current facility-administered medications for this visit.    Allergies  Allergen Reactions  . Famotidine   . Penicillins   . Sulfa Antibiotics   . Tetracyclines & Related     Patient Active Problem List   Diagnosis Date Noted  . Hypertension     Priority: Medium  . Anemia  07/19/2012  . Malaise and fatigue 07/19/2012  . Hypothyroid 07/19/2012  . Community acquired pneumonia 12/30/2011  . Atrial fibrillation with RVR 12/30/2011  . Cough 12/30/2011  . Sore throat 12/30/2011  . Leukocytosis 12/30/2011  . Onychomycosis of toenail 10/26/2011  . Weight loss, non-intentional 01/25/2011  . Hypothyroidism     History  Smoking status  . Never Smoker   Smokeless tobacco  . Not on file    History  Alcohol Use No    No family history on file.  Review of Systems: Constitutional: no fever chills diaphoresis or fatigue or change in weight.  Head and neck: no hearing loss, no epistaxis, no photophobia or visual disturbance. Respiratory: No cough, shortness of breath or wheezing. Cardiovascular: No chest pain peripheral edema, palpitations. Gastrointestinal: No abdominal distention, no abdominal pain, no change in bowel habits hematochezia or  melena. Genitourinary: No dysuria, no frequency, no urgency, no nocturia. Musculoskeletal:No arthralgias, no back pain, no gait disturbance or myalgias. Neurological: No dizziness, no headaches, no numbness, no seizures, no syncope, no weakness, no tremors. Hematologic: No lymphadenopathy, no easy bruising. Psychiatric: No confusion, no hallucinations, no sleep disturbance.    Physical Exam: Filed Vitals:   07/19/12 1444  BP: 118/68  Pulse: 62   general appearance reveals an elderly woman in no distress.The head and neck exam reveals pupils equal and reactive.  Extraocular movements are full.  There is no scleral icterus.  The mouth and pharynx are normal.  The neck is supple.  The carotids reveal no bruits.  The jugular venous pressure is normal.  The  thyroid is not enlarged.  There is no lymphadenopathy.  The chest is clear to percussion and auscultation.  There are no rales or rhonchi.  Expansion of the chest is symmetrical.  The precordium is quiet.  The pulse is irregularly irregular. The first heart sound is normal.  The second heart sound is physiologically split.  There is no murmur gallop rub or click.  There is no abnormal lift or heave.  The abdomen is soft and nontender.  The bowel sounds are normal.  The liver and spleen are not enlarged.  There are no abdominal masses.  There are no abdominal bruits.  Extremities reveal good pedal pulses.  There is chronic lymphedema of the right arm following remote radical mastectomy.  There is bilateral pretibial and pedal edema. There is no cyanosis or clubbing.  Strength is normal and symmetrical in all extremities.  There is no lateralizing weakness.  There are no sensory deficits.  The skin is warm and dry.  There is no rash.     Assessment / Plan: Continue on same medication.  Today we are checking CBC basal metabolic panel hepatic function panel and TSH We are increasing her furosemide up to 60 mg daily because of her increasing  peripheral edema Recheck in regular followup office visit in July

## 2012-07-19 NOTE — Assessment & Plan Note (Signed)
Since March the patient's weight is down another 1 pound

## 2012-07-19 NOTE — Patient Instructions (Signed)
Will obtain labs today and call you with the results (CBC/BMET/HFP/TSH)  INCREASE LASIX (FUROSEMIDE) TO 40 MG 1 AND 1/2 TABLETS DAILY  Follow up in July

## 2012-07-19 NOTE — Assessment & Plan Note (Signed)
The patient has a history of atrial fibrillation.  She is on long-term Coumadin which is followed at Dr. Jeannetta Nap office.  She has not been aware of any hematochezia or melena.  She has had no TIA symptoms.

## 2012-07-19 NOTE — Assessment & Plan Note (Signed)
The patient feels tired and does not have much energy.  In March she was mildly anemic with a hemoglobin of 11.  She does not have any obvious source of blood loss but is on long-term Coumadin.  We are rechecking a CBC today as well as chemistries and a TSH to evaluate her malaise and fatigue.

## 2012-07-19 NOTE — Assessment & Plan Note (Signed)
The patient is clinically euthyroid. 

## 2012-07-22 ENCOUNTER — Telehealth: Payer: Self-pay | Admitting: *Deleted

## 2012-07-22 NOTE — Telephone Encounter (Signed)
Advised patient of lab results  

## 2012-07-22 NOTE — Telephone Encounter (Signed)
Message copied by Burnell Blanks on Mon Jul 22, 2012  3:25 PM ------      Message from: Cassell Clement      Created: Sun Jul 21, 2012  4:30 PM       Please report.  Hgb is stable and unchanged.  Thyroid ok.  Liver and kidneys okay. No cause for malaise found. ------

## 2012-07-22 NOTE — Telephone Encounter (Signed)
Message copied by Burnell Blanks on Mon Jul 22, 2012  3:24 PM ------      Message from: Cassell Clement      Created: Sun Jul 21, 2012  4:30 PM       Please report.  Hgb is stable and unchanged.  Thyroid ok.  Liver and kidneys okay. No cause for malaise found. ------

## 2012-08-22 ENCOUNTER — Other Ambulatory Visit: Payer: Self-pay | Admitting: *Deleted

## 2012-08-22 DIAGNOSIS — F419 Anxiety disorder, unspecified: Secondary | ICD-10-CM

## 2012-08-22 MED ORDER — CHLORDIAZEPOXIDE HCL 5 MG PO CAPS: 5.0000 mg | ORAL_CAPSULE | Freq: Every day | ORAL | Status: DC | PRN

## 2012-08-27 ENCOUNTER — Telehealth: Payer: Self-pay | Admitting: Cardiology

## 2012-08-27 NOTE — Telephone Encounter (Signed)
Keep regular appointment as scheduled in July and get a CBC at that visit.  Call sooner if she notes any new symptoms.

## 2012-08-27 NOTE — Telephone Encounter (Signed)
Will forward to Dr Patty Sermons for his knowledge

## 2012-08-27 NOTE — Telephone Encounter (Signed)
New Prob    Pt had blood work done at Dr. Jeannetta Nap office in [place] and pt states they found blood in her stool. She wanted to notify Dr. Patty Sermons of this.

## 2012-08-27 NOTE — Telephone Encounter (Signed)
Pt aware of Dr Yevonne Pax orders and will follow up as scheduled

## 2012-08-28 ENCOUNTER — Telehealth: Payer: Self-pay | Admitting: *Deleted

## 2012-08-28 NOTE — Telephone Encounter (Signed)
Pt has app with Dr Dulce Sellar this week, asked pt to call back pcp and see if she should remain on coumadin/ unsure of hgb, pt did not know she blood in her stool till test results showed positive- no frank blood.  pt agreed to plan.

## 2012-08-28 NOTE — Telephone Encounter (Signed)
Call Documentation    Rocco Serene, RN at 08/27/2012 12:50 PM    Status: Signed             Pt aware of Dr Yevonne Pax orders and will follow up as scheduled        Cassell Clement, MD at 08/27/2012 12:36 PM    Status: Signed             Keep regular appointment as scheduled in July and get a CBC at that visit. Call sooner if she notes any new symptoms.        Rocco Serene, RN at 08/27/2012 11:20 AM    Status: Signed             Will forward to Dr Patty Sermons for his knowledge        Erlinda Hong at 08/27/2012 11:07 AM    Status: Signed             New Prob  Pt had blood work done at Dr. Jeannetta Nap office in [place] and pt states they found blood in her stool. She wanted to notify Dr. Patty Sermons of this.          Visit Pharmacy    [facility] [place]  - [address] [address]      Contacts      Type Contact Phone   08/27/2012 11:04 AM Phone (Incoming) Liane, [place] (Self) [phone] (H)      Routing History    Priority Sent On From To Message Type    08/27/2012 12:36 PM Cassell Clement, MD [facility] Triage Patient Calls    08/27/2012 11:20 AM Rocco Serene, RN Cassell Clement, MD Patient Calls    08/27/2012 11:07 AM Irene Limbo Triage Patient Calls      Created by    Erlinda Hong on 08/27/2012 11:04 AM

## 2012-08-30 ENCOUNTER — Other Ambulatory Visit: Payer: Self-pay | Admitting: Gastroenterology

## 2012-08-30 DIAGNOSIS — E611 Iron deficiency: Secondary | ICD-10-CM

## 2012-08-30 DIAGNOSIS — D649 Anemia, unspecified: Secondary | ICD-10-CM

## 2012-09-04 ENCOUNTER — Ambulatory Visit
Admission: RE | Admit: 2012-09-04 | Discharge: 2012-09-04 | Disposition: A | Discharge status: Home / Self Care | Payer: Medicare Other | Source: Ambulatory Visit | Attending: Gastroenterology | Admitting: Gastroenterology

## 2012-09-04 ENCOUNTER — Other Ambulatory Visit: Payer: Medicare Other

## 2012-09-04 DIAGNOSIS — D649 Anemia, unspecified: Secondary | ICD-10-CM

## 2012-09-04 DIAGNOSIS — E611 Iron deficiency: Secondary | ICD-10-CM

## 2012-09-04 MED ORDER — IOHEXOL 300 MG/ML  SOLN
100.0000 mL | Freq: Once | INTRAMUSCULAR | Status: AC | PRN
  Administered 2012-09-04: 100 mL via INTRAVENOUS

## 2012-09-10 ENCOUNTER — Telehealth: Payer: Self-pay | Admitting: Cardiology

## 2012-09-11 ENCOUNTER — Other Ambulatory Visit: Payer: Self-pay | Admitting: Gastroenterology

## 2012-09-11 DIAGNOSIS — R634 Abnormal weight loss: Secondary | ICD-10-CM

## 2012-09-11 DIAGNOSIS — D649 Anemia, unspecified: Secondary | ICD-10-CM

## 2012-09-11 DIAGNOSIS — R194 Change in bowel habit: Secondary | ICD-10-CM

## 2012-09-11 DIAGNOSIS — E611 Iron deficiency: Secondary | ICD-10-CM

## 2012-09-19 ENCOUNTER — Ambulatory Visit
Admission: RE | Admit: 2012-09-19 | Discharge: 2012-09-19 | Disposition: A | Discharge status: Home / Self Care | Payer: Medicare Other | Source: Ambulatory Visit | Attending: Gastroenterology | Admitting: Gastroenterology

## 2012-09-19 ENCOUNTER — Other Ambulatory Visit: Payer: Self-pay | Admitting: Gastroenterology

## 2012-09-19 DIAGNOSIS — D649 Anemia, unspecified: Secondary | ICD-10-CM

## 2012-09-19 DIAGNOSIS — R634 Abnormal weight loss: Secondary | ICD-10-CM

## 2012-09-19 DIAGNOSIS — R194 Change in bowel habit: Secondary | ICD-10-CM

## 2012-09-19 DIAGNOSIS — E611 Iron deficiency: Secondary | ICD-10-CM

## 2012-09-30 ENCOUNTER — Ambulatory Visit: Payer: Medicare Other | Admitting: Cardiology

## 2012-10-03 ENCOUNTER — Ambulatory Visit (INDEPENDENT_AMBULATORY_CARE_PROVIDER_SITE_OTHER): Payer: Medicare Other | Admitting: Cardiology

## 2012-10-03 ENCOUNTER — Encounter: Payer: Self-pay | Admitting: Cardiology

## 2012-10-03 VITALS — BP 122/58 | HR 72 | Ht 62.0 in | Wt 104.4 lb

## 2012-10-03 DIAGNOSIS — E039 Hypothyroidism, unspecified: Secondary | ICD-10-CM

## 2012-10-03 DIAGNOSIS — D509 Iron deficiency anemia, unspecified: Secondary | ICD-10-CM

## 2012-10-03 DIAGNOSIS — I4891 Unspecified atrial fibrillation: Secondary | ICD-10-CM

## 2012-10-03 NOTE — Assessment & Plan Note (Signed)
The patient has a history of hypothyroidism.  She is clinically euthyroid on current dose of Synthroid

## 2012-10-03 NOTE — Assessment & Plan Note (Signed)
Patient remains in chronic established atrial fibrillation.  She's not been aware of any increased chest pain or shortness of breath.  She remains on long-term warfarin managed through Dr. Jeannetta Nap office

## 2012-10-03 NOTE — Progress Notes (Signed)
[name] Date of Birth:  [birth year] [facility] [address] [place], [place]  [address] [phone]         Fax   [phone]  History of Present Illness: This pleasant 79 year old woman is seen for a followup office visit. . She has a history of atrial fibrillation with controlled ventricular response. He also has a history of essential hypertension. She has had some previous unintentional weight loss. He does not have any history of ischemic heart disease and she had a normal adenosine Cardiolite stress test in 2008. She had an echocardiogram in 2009 showing an ejection fraction of 60-65% with mild aortic stenosis and moderate pulmonary hypertension and moderate mitral regurgitation.  When we last saw her he was having problems with increased fatigue and exhaustion and was found to have iron deficiency anemia.  She is being treated and followed for this by Dr. Jeannetta Nap and Dr. Dulce Sellar.  She had a full column barium enema.  Dr. Jeannetta Nap started her on ferrous sulfate 325 mg 1 daily and she feels much better.   Current Outpatient Prescriptions  Medication Sig Dispense Refill  . aspirin 81 MG tablet Take 81 mg by mouth daily.        . chlordiazePOXIDE (LIBRIUM) 5 MG capsule Take 1 capsule (5 mg total) by mouth daily as needed. For nerves  30 capsule  5  . digoxin (LANOXIN) 0.125 MG tablet Take 1 tablet (125 mcg total) by mouth daily.  90 tablet  3  . diltiazem (CARDIZEM CD) 300 MG 24 hr capsule Take 1 capsule (300 mg total) by mouth daily.  90 capsule  3  . dorzolamide (TRUSOPT) 2 % ophthalmic solution Place 1 drop into both eyes 2 (two) times daily.       . ferrous sulfate 325 (65 FE) MG tablet Take 325 mg by mouth daily with breakfast.      . furosemide (LASIX) 40 MG tablet Take 40 mg by mouth as directed. 1 and 1/2 tablet daily      . guaiFENesin-dextromethorphan (ROBITUSSIN DM) 100-10 MG/5ML syrup Take 5 mLs by mouth every 4 (four) hours as needed for cough.  118 mL   0  . isosorbide mononitrate (IMDUR) 60 MG 24 hr tablet Take 1 tablet (60 mg total) by mouth daily.  90 tablet  3  . latanoprost (XALATAN) 0.005 % ophthalmic solution Place 1 drop into both eyes at bedtime.       Marland Kitchen levothyroxine (SYNTHROID, LEVOTHROID) 100 MCG tablet Take 1 tablet (100 mcg total) by mouth daily.  93 tablet  3  . LUTEIN PO Take 6-20 mg by mouth daily.      . metoprolol succinate (TOPROL-XL) 100 MG 24 hr tablet Take 1/2 tablet by mouth twice daily.  90 tablet  3  . Multiple Vitamin (MULTIVITAMIN) tablet Take 1 tablet by mouth daily.      . nitroGLYCERIN (NITROSTAT) 0.4 MG SL tablet Place 0.4 mg under the tongue every 5 (five) minutes as needed. For chest pains       . potassium chloride SA (K-DUR,KLOR-CON) 20 MEQ tablet Take 1 tablet (20 mEq total) by mouth every other day.  90 tablet  3  . vitamin C (ASCORBIC ACID) 500 MG tablet Take 500 mg by mouth daily.      . vitamin E 200 UNIT capsule Take 200 Units by mouth daily.      Marland Kitchen warfarin (COUMADIN) 5 MG tablet Take as directed by coumadin clinic  30 tablet  3   No current facility-administered medications for this visit.    Allergies  Allergen Reactions  . Famotidine   . Penicillins   . Sulfa Antibiotics   . Tetracyclines & Related     Patient Active Problem List   Diagnosis Date Noted  . Hypertension     Priority: Medium  . Iron deficiency anemia 10/03/2012  . Anemia 07/19/2012  . Malaise and fatigue 07/19/2012  . Hypothyroid 07/19/2012  . Community acquired pneumonia 12/30/2011  . Atrial fibrillation with RVR 12/30/2011  . Cough 12/30/2011  . Sore throat 12/30/2011  . Leukocytosis 12/30/2011  . Onychomycosis of toenail 10/26/2011  . Weight loss, non-intentional 01/25/2011  . Hypothyroidism     History  Smoking status  . Never Smoker   Smokeless tobacco  . Not on file    History  Alcohol Use No    No family history on file.  Review of Systems: Constitutional: no fever chills diaphoresis or fatigue  or change in weight.  Head and neck: no hearing loss, no epistaxis, no photophobia or visual disturbance. Respiratory: No cough, shortness of breath or wheezing. Cardiovascular: No chest pain peripheral edema, palpitations. Gastrointestinal: No abdominal distention, no abdominal pain, no change in bowel habits hematochezia or melena. Genitourinary: No dysuria, no frequency, no urgency, no nocturia. Musculoskeletal:No arthralgias, no back pain, no gait disturbance or myalgias. Neurological: No dizziness, no headaches, no numbness, no seizures, no syncope, no weakness, no tremors. Hematologic: No lymphadenopathy, no easy bruising. Psychiatric: No confusion, no hallucinations, no sleep disturbance.    Physical Exam: Filed Vitals:   10/03/12 1438  BP: 122/58  Pulse: 72   the general appearance reveals an elderly woman in no distress.The head and neck exam reveals pupils equal and reactive.  Extraocular movements are full.  There is no scleral icterus.  The mouth and pharynx are normal.  The neck is supple.  The carotids reveal no bruits.  The jugular venous pressure is normal.  The  thyroid is not enlarged.  There is no lymphadenopathy.  Right breast surgically absent and she has chronic lymphedema of right arm. The chest is clear to percussion and auscultation.  There are no rales or rhonchi.  Expansion of the chest is symmetrical.  The precordium is quiet.  The first heart sound is normal.  The second heart sound is physiologically split.  There is no murmur gallop rub or click.  The pulse is irregularly irregular in atrial fibrillation. There is no abnormal lift or heave.  The abdomen is soft and nontender.  The bowel sounds are normal.  The liver and spleen are not enlarged.  There are no abdominal masses.  There are no abdominal bruits.  Extremities reveal good pedal pulses.  There is no phlebitis or edema.  There is no cyanosis or clubbing.  Strength is normal and symmetrical in all extremities.   There is no lateralizing weakness.  There are no sensory deficits.  The skin is warm and dry.  There is no rash.     Assessment / Plan: Continue same medication including oral iron which she is tolerating.  Recheck here in 3 months for followup office visit and EKG

## 2012-10-03 NOTE — Assessment & Plan Note (Signed)
She definitely feels stronger and less fatigued since starting the iron therapy

## 2012-10-03 NOTE — Patient Instructions (Addendum)
Your physician recommends that you continue on your current medications as directed. Please refer to the Current Medication list given to you today.  Your physician wants you to follow-up in: 3 month ov/ekg You will receive a reminder letter in the mail two months in advance. If you don't receive a letter, please call our office to schedule the follow-up appointment.  

## 2012-11-29 ENCOUNTER — Telehealth: Payer: Self-pay | Admitting: Cardiology

## 2012-11-29 NOTE — Telephone Encounter (Signed)
New Problem  States that she have sent a detailed written order via fax// wanting to make sure that it was received and faxed back as soon as possible.

## 2012-11-29 NOTE — Telephone Encounter (Signed)
Left message received and will fax back as soon as  Dr. Patty Sermons signs

## 2012-12-04 ENCOUNTER — Encounter: Payer: Self-pay | Admitting: Cardiology

## 2013-01-01 ENCOUNTER — Encounter: Payer: Self-pay | Admitting: Cardiology

## 2013-01-01 ENCOUNTER — Ambulatory Visit (INDEPENDENT_AMBULATORY_CARE_PROVIDER_SITE_OTHER): Payer: Medicare Other | Admitting: Cardiology

## 2013-01-01 VITALS — BP 133/71 | HR 73 | Ht 59.0 in | Wt 102.0 lb

## 2013-01-01 DIAGNOSIS — R634 Abnormal weight loss: Secondary | ICD-10-CM

## 2013-01-01 DIAGNOSIS — I4891 Unspecified atrial fibrillation: Secondary | ICD-10-CM

## 2013-01-01 DIAGNOSIS — J189 Pneumonia, unspecified organism: Secondary | ICD-10-CM

## 2013-01-01 DIAGNOSIS — D509 Iron deficiency anemia, unspecified: Secondary | ICD-10-CM

## 2013-01-01 DIAGNOSIS — I10 Essential (primary) hypertension: Secondary | ICD-10-CM

## 2013-01-01 DIAGNOSIS — R5381 Other malaise: Secondary | ICD-10-CM

## 2013-01-01 LAB — CBC WITH DIFFERENTIAL/PLATELET
Basophils Absolute: 0 10*3/uL (ref 0.0–0.1)
Basophils Relative: 0.5 % (ref 0.0–3.0)
Eosinophils Absolute: 0 10*3/uL (ref 0.0–0.7)
Eosinophils Relative: 0 % (ref 0.0–5.0)
HCT: 32.3 % — ABNORMAL LOW (ref 36.0–46.0)
Hemoglobin: 10.7 g/dL — ABNORMAL LOW (ref 12.0–15.0)
Lymphocytes Relative: 13.4 % (ref 12.0–46.0)
Lymphs Abs: 1.1 10*3/uL (ref 0.7–4.0)
MCHC: 33.2 g/dL (ref 30.0–36.0)
MCV: 93.1 fl (ref 78.0–100.0)
Monocytes Absolute: 0.6 10*3/uL (ref 0.1–1.0)
Monocytes Relative: 7.6 % (ref 3.0–12.0)
Neutro Abs: 6.4 10*3/uL (ref 1.4–7.7)
Neutrophils Relative %: 78.5 % — ABNORMAL HIGH (ref 43.0–77.0)
Platelets: 315 10*3/uL (ref 150.0–400.0)
RBC: 3.47 Mil/uL — ABNORMAL LOW (ref 3.87–5.11)
RDW: 14.8 % — ABNORMAL HIGH (ref 11.5–14.6)
WBC: 8.1 10*3/uL (ref 4.5–10.5)

## 2013-01-01 NOTE — Assessment & Plan Note (Signed)
The patient denies any dizziness or syncope.  Blood pressure has been remaining stable on current therapy.

## 2013-01-01 NOTE — Patient Instructions (Signed)
Will obtain labs today and call you with the results (CBC)  STOP ASPIRIN  Your physician wants you to follow-up in: 3 month ov You will receive a reminder letter in the mail two months in advance. If you don't receive a letter, please call our office to schedule the follow-up appointment.

## 2013-01-01 NOTE — Assessment & Plan Note (Signed)
The patient has lost another 2 pounds since last visit.  She states that her appetite is good.

## 2013-01-01 NOTE — Progress Notes (Signed)
[name] Date of Birth:  1914/09/05 [address] [place], [place]  [address] [phone]         Fax   [phone]  History of Present Illness: This pleasant 78 year old woman is seen for a followup office visit. . She has a history of atrial fibrillation with controlled ventricular response. He also has a history of essential hypertension. She has had some previous unintentional weight loss. He does not have any history of ischemic heart disease and she had a normal adenosine Cardiolite stress test in 2008. She had an echocardiogram in 2009 showing an ejection fraction of 60-65% with mild aortic stenosis and moderate pulmonary hypertension and moderate mitral regurgitation.  When we last saw her he was having problems with increased fatigue and exhaustion and was found to have iron deficiency anemia.  She is being treated and followed for this by Dr. Jeannetta Nap and Dr. Dulce Sellar.  She had a full column barium enema.  The barium enema could not exclude a possible lesion in the right colon. Dr. Jeannetta Nap started her on ferrous sulfate 325 mg 1 daily and she feels much better.   Current Outpatient Prescriptions  Medication Sig Dispense Refill  . chlordiazePOXIDE (LIBRIUM) 5 MG capsule Take 1 capsule (5 mg total) by mouth daily as needed. For nerves  30 capsule  5  . digoxin (LANOXIN) 0.125 MG tablet Take 1 tablet (125 mcg total) by mouth daily.  90 tablet  3  . diltiazem (CARDIZEM CD) 300 MG 24 hr capsule Take 1 capsule (300 mg total) by mouth daily.  90 capsule  3  . dorzolamide (TRUSOPT) 2 % ophthalmic solution Place 1 drop into both eyes 2 (two) times daily.       . ferrous sulfate 325 (65 FE) MG tablet Take 325 mg by mouth daily with breakfast.      . furosemide (LASIX) 40 MG tablet Take 40 mg by mouth as directed. 1 and 1/2 tablet daily      . guaiFENesin-dextromethorphan (ROBITUSSIN DM) 100-10 MG/5ML syrup Take 5 mLs by mouth every 4 (four) hours as needed for cough.  118 mL  0   . isosorbide mononitrate (IMDUR) 60 MG 24 hr tablet Take 1 tablet (60 mg total) by mouth daily.  90 tablet  3  . latanoprost (XALATAN) 0.005 % ophthalmic solution Place 1 drop into both eyes at bedtime.       Marland Kitchen levothyroxine (SYNTHROID, LEVOTHROID) 100 MCG tablet Take 1 tablet (100 mcg total) by mouth daily.  93 tablet  3  . LUTEIN PO Take 6-20 mg by mouth daily.      . metoprolol succinate (TOPROL-XL) 100 MG 24 hr tablet Take 1/2 tablet by mouth twice daily.  90 tablet  3  . Multiple Vitamin (MULTIVITAMIN) tablet Take 1 tablet by mouth daily.      . nitroGLYCERIN (NITROSTAT) 0.4 MG SL tablet Place 0.4 mg under the tongue every 5 (five) minutes as needed. For chest pains       . potassium chloride SA (K-DUR,KLOR-CON) 20 MEQ tablet Take 1 tablet (20 mEq total) by mouth every other day.  90 tablet  3  . vitamin C (ASCORBIC ACID) 500 MG tablet Take 500 mg by mouth daily.      . vitamin E 200 UNIT capsule Take 200 Units by mouth daily.      Marland Kitchen warfarin (COUMADIN) 5 MG tablet Take as directed by coumadin clinic  30 tablet  3   No current  facility-administered medications for this visit.    Allergies  Allergen Reactions  . Famotidine   . Penicillins   . Sulfa Antibiotics   . Tetracyclines & Related     Patient Active Problem List   Diagnosis Date Noted  . Hypertension     Priority: Medium  . Iron deficiency anemia 10/03/2012  . Anemia 07/19/2012  . Malaise and fatigue 07/19/2012  . Hypothyroid 07/19/2012  . Community acquired pneumonia 12/30/2011  . Atrial fibrillation with RVR 12/30/2011  . Cough 12/30/2011  . Sore throat 12/30/2011  . Leukocytosis 12/30/2011  . Onychomycosis of toenail 10/26/2011  . Weight loss, non-intentional 01/25/2011  . Hypothyroidism     History  Smoking status  . Never Smoker   Smokeless tobacco  . Not on file    History  Alcohol Use No    No family history on file.  Review of Systems: Constitutional: no fever chills diaphoresis or fatigue  or change in weight.  Head and neck: no hearing loss, no epistaxis, no photophobia or visual disturbance. Respiratory: No cough, shortness of breath or wheezing. Cardiovascular: No chest pain peripheral edema, palpitations. Gastrointestinal: No abdominal distention, no abdominal pain, no change in bowel habits hematochezia or melena. Genitourinary: No dysuria, no frequency, no urgency, no nocturia. Musculoskeletal:No arthralgias, no back pain, no gait disturbance or myalgias. Neurological: No dizziness, no headaches, no numbness, no seizures, no syncope, no weakness, no tremors. Hematologic: No lymphadenopathy, no easy bruising. Psychiatric: No confusion, no hallucinations, no sleep disturbance.    Physical Exam: Filed Vitals:   01/01/13 1116  BP: 133/71  Pulse: 73   the general appearance reveals an elderly woman in no distress.her skin reveals pallor. The head and neck exam reveals pupils equal and reactive.  Extraocular movements are full.  There is no scleral icterus.  The mouth and pharynx are normal.  The neck is supple.  The carotids reveal no bruits.  The jugular venous pressure is normal.  The  thyroid is not enlarged.  There is no lymphadenopathy.  Right breast surgically absent and she has chronic lymphedema of right arm. The chest is clear to percussion and auscultation.  There are no rales or rhonchi.  Expansion of the chest is symmetrical.  The precordium is quiet.  The first heart sound is normal.  The second heart sound is physiologically split.  There is no murmur gallop rub or click.  The pulse is irregularly irregular in atrial fibrillation. There is no abnormal lift or heave.  The abdomen is soft and nontender.  The bowel sounds are normal.  The liver and spleen are not enlarged.  There are no abdominal masses.  There are no abdominal bruits.  Extremities reveal good pedal pulses.  There is no phlebitis or edema.  There is no cyanosis or clubbing.  Strength is normal and  symmetrical in all extremities.  There is no lateralizing weakness.  There are no sensory deficits.  The skin is warm and dry.  There is no rash.  EKG shows atrial fibrillation with controlled ventricular response and nonspecific ST-T wave changes and is unchanged since 05/23/12   Assessment / Plan: We are going to stop her baby aspirin.  She will continue her Coumadin for her atrial fibrillation.  Continue other medicines the same M.D. recheck in 3 months.  CBC today pending

## 2013-01-01 NOTE — Assessment & Plan Note (Signed)
She continues to complain of malaise and fatigue.  She appears somewhat pale today.  We will check a CBC.

## 2013-01-01 NOTE — Assessment & Plan Note (Signed)
The patient is in permanent atrial fibrillation.  Her ventricular response is controlled.  Pulse today is 78 per minute.  She is on long-term Coumadin.  She has not had any TIA symptoms.

## 2013-03-19 ENCOUNTER — Encounter (INDEPENDENT_AMBULATORY_CARE_PROVIDER_SITE_OTHER): Payer: Medicare Other | Admitting: Ophthalmology

## 2013-03-19 DIAGNOSIS — H43819 Vitreous degeneration, unspecified eye: Secondary | ICD-10-CM

## 2013-03-19 DIAGNOSIS — I10 Essential (primary) hypertension: Secondary | ICD-10-CM

## 2013-03-19 DIAGNOSIS — H3530 Unspecified macular degeneration: Secondary | ICD-10-CM

## 2013-03-19 DIAGNOSIS — H35039 Hypertensive retinopathy, unspecified eye: Secondary | ICD-10-CM

## 2013-03-27 ENCOUNTER — Other Ambulatory Visit: Payer: Self-pay

## 2013-04-10 ENCOUNTER — Encounter: Payer: Self-pay | Admitting: Cardiology

## 2013-04-10 ENCOUNTER — Ambulatory Visit (INDEPENDENT_AMBULATORY_CARE_PROVIDER_SITE_OTHER): Payer: Medicare Other | Admitting: Cardiology

## 2013-04-10 VITALS — BP 121/57 | HR 72 | Ht 62.0 in | Wt 101.0 lb

## 2013-04-10 DIAGNOSIS — I4891 Unspecified atrial fibrillation: Secondary | ICD-10-CM

## 2013-04-10 DIAGNOSIS — I10 Essential (primary) hypertension: Secondary | ICD-10-CM

## 2013-04-10 DIAGNOSIS — R059 Cough, unspecified: Secondary | ICD-10-CM

## 2013-04-10 DIAGNOSIS — R634 Abnormal weight loss: Secondary | ICD-10-CM

## 2013-04-10 DIAGNOSIS — I119 Hypertensive heart disease without heart failure: Secondary | ICD-10-CM

## 2013-04-10 DIAGNOSIS — D509 Iron deficiency anemia, unspecified: Secondary | ICD-10-CM

## 2013-04-10 DIAGNOSIS — R05 Cough: Secondary | ICD-10-CM

## 2013-04-10 NOTE — Progress Notes (Signed)
[name] Date of Birth:  [birth year] [address] [place], [place]  [address] [phone]         Fax   [phone]  History of Present Illness: This pleasant 80 year old woman is seen for a followup office visit. . She has a history of atrial fibrillation with controlled ventricular response. He also has a history of essential hypertension. She has had some previous unintentional weight loss. He does not have any history of ischemic heart disease and she had a normal adenosine Cardiolite stress test in 2008. She had an echocardiogram in 2009 showing an ejection fraction of 60-65% with mild aortic stenosis and moderate pulmonary hypertension and moderate mitral regurgitation.  When we last saw her he was having problems with increased fatigue and exhaustion and was found to have iron deficiency anemia.  She is being treated and followed for this by Dr. Arelia Sneddon and Dr. Paulita Fujita.  She had a full column barium enema.  The barium enema could not exclude a possible lesion in the right colon. Dr. Arelia Sneddon started her on ferrous sulfate 325 mg 1 daily and she feels much better.  Her stools remained dark because of being on the iron therapy.   Current Outpatient Prescriptions  Medication Sig Dispense Refill  . chlordiazePOXIDE (LIBRIUM) 5 MG capsule Take 1 capsule (5 mg total) by mouth daily as needed. For nerves  30 capsule  5  . digoxin (LANOXIN) 0.125 MG tablet Take 1 tablet (125 mcg total) by mouth daily.  90 tablet  3  . diltiazem (CARDIZEM CD) 300 MG 24 hr capsule Take 1 capsule (300 mg total) by mouth daily.  90 capsule  3  . dorzolamide (TRUSOPT) 2 % ophthalmic solution Place 1 drop into both eyes 2 (two) times daily.       . dorzolamide-timolol (COSOPT) 22.3-6.8 MG/ML ophthalmic solution       . ferrous sulfate 325 (65 FE) MG tablet Take 325 mg by mouth daily with breakfast.      . furosemide (LASIX) 40 MG tablet Take 40 mg by mouth as directed. 1 and 1/2 tablet daily        . guaiFENesin-dextromethorphan (ROBITUSSIN DM) 100-10 MG/5ML syrup Take 5 mLs by mouth every 4 (four) hours as needed for cough.  118 mL  0  . isosorbide mononitrate (IMDUR) 60 MG 24 hr tablet Take 1 tablet (60 mg total) by mouth daily.  90 tablet  3  . latanoprost (XALATAN) 0.005 % ophthalmic solution Place 1 drop into both eyes at bedtime.       Marland Kitchen levothyroxine (SYNTHROID, LEVOTHROID) 100 MCG tablet Take 1 tablet (100 mcg total) by mouth daily.  93 tablet  3  . LUTEIN PO Take 6-20 mg by mouth daily.      . metoprolol succinate (TOPROL-XL) 100 MG 24 hr tablet Take 1/2 tablet by mouth twice daily.  90 tablet  3  . Multiple Vitamin (MULTIVITAMIN) tablet Take 1 tablet by mouth daily.      . nitroGLYCERIN (NITROSTAT) 0.4 MG SL tablet Place 0.4 mg under the tongue every 5 (five) minutes as needed. For chest pains       . potassium chloride SA (K-DUR,KLOR-CON) 20 MEQ tablet Take 1 tablet (20 mEq total) by mouth every other day.  90 tablet  3  . vitamin C (ASCORBIC ACID) 500 MG tablet Take 500 mg by mouth daily.      . vitamin E 200 UNIT capsule Take 200 Units by mouth  daily.      . warfarin (COUMADIN) 5 MG tablet Take as directed by coumadin clinic  30 tablet  3   No current facility-administered medications for this visit.    Allergies  Allergen Reactions  . Famotidine   . Penicillins   . Sulfa Antibiotics   . Tetracyclines & Related     Patient Active Problem List   Diagnosis Date Noted  . Hypertension     Priority: Medium  . Iron deficiency anemia 10/03/2012  . Anemia 07/19/2012  . Malaise and fatigue 07/19/2012  . Hypothyroid 07/19/2012  . Community acquired pneumonia 12/30/2011  . Atrial fibrillation with RVR 12/30/2011  . Cough 12/30/2011  . Sore throat 12/30/2011  . Leukocytosis 12/30/2011  . Onychomycosis of toenail 10/26/2011  . Weight loss, non-intentional 01/25/2011  . Hypothyroidism     History  Smoking status  . Never Smoker   Smokeless tobacco  . Not on file     History  Alcohol Use No    No family history on file.  Review of Systems: Constitutional: no fever chills diaphoresis or fatigue or change in weight.  Head and neck: no hearing loss, no epistaxis, no photophobia or visual disturbance. Respiratory: No cough, shortness of breath or wheezing. Cardiovascular: No chest pain peripheral edema, palpitations. Gastrointestinal: No abdominal distention, no abdominal pain, no change in bowel habits hematochezia or melena. Genitourinary: No dysuria, no frequency, no urgency, no nocturia. Musculoskeletal:No arthralgias, no back pain, no gait disturbance or myalgias. Neurological: No dizziness, no headaches, no numbness, no seizures, no syncope, no weakness, no tremors. Hematologic: No lymphadenopathy, no easy bruising. Psychiatric: No confusion, no hallucinations, no sleep disturbance.    Physical Exam: Filed Vitals:   04/10/13 1408  BP: 121/57  Pulse: 72   the general appearance reveals an elderly woman in no distress.her skin reveals pallor. The head and neck exam reveals pupils equal and reactive.  Extraocular movements are full.  There is no scleral icterus.  The mouth and pharynx are normal.  The neck is supple.  The carotids reveal no bruits.  The jugular venous pressure is normal.  The  thyroid is not enlarged.  There is no lymphadenopathy.  Right breast surgically absent and she has chronic lymphedema of right arm. The chest is clear to percussion and auscultation.  There are no rales or rhonchi.  Expansion of the chest is symmetrical.  The precordium is quiet.  The first heart sound is normal.  The second heart sound is physiologically split.  There is no murmur gallop rub or click.  The pulse is irregularly irregular in atrial fibrillation. There is no abnormal lift or heave.  The abdomen is soft and nontender.  The bowel sounds are normal.  The liver and spleen are not enlarged.  There are no abdominal masses.  There are no abdominal  bruits.  Extremities reveal good pedal pulses.  There is no phlebitis or edema.  There is no cyanosis or clubbing.  Strength is normal and symmetrical in all extremities.  There is no lateralizing weakness.  There are no sensory deficits.  The skin is warm and dry.  There is no rash.  EKG shows atrial fibrillation with controlled ventricular response and nonspecific ST-T wave changes and is unchanged since 05/23/12   Assessment / Plan: Continue on current medication.  She may try some plain Mucinex for cough and sinus congestion if needed.  Continue close followup of her hemoglobin and her INR with Dr. Arelia Sneddon.  Recheck here  in 3 months for followup office visit EKG and basal metabolic panel.

## 2013-04-10 NOTE — Patient Instructions (Signed)
Your physician recommends that you continue on your current medications as directed. Please refer to the Current Medication list given to you today.  Your physician wants you to follow-up in: 3 month ov/ekg/bmet You will receive a reminder letter in the mail two months in advance. If you don't receive a letter, please call our office to schedule the follow-up appointment.

## 2013-04-10 NOTE — Assessment & Plan Note (Signed)
Patient remains in atrial fibrillation with controlled ventricular response.  She's not had any TIA symptoms.  She is on long-term Coumadin which is managed through Dr. Arelia Sneddon office.

## 2013-04-10 NOTE — Assessment & Plan Note (Signed)
Blood pressure was remaining stable on current therapy.  No dizziness or syncope.  She still has good exercise tolerance.  She walks with a cane for balance.

## 2013-04-10 NOTE — Assessment & Plan Note (Signed)
She has had a nonproductive cough.  She has a lot of sinus drainage.  She is not running a fever.  She is not on an ACE inhibitor.  She had a history of community-acquired pneumonia a year ago.  Her last chest x-ray on 01/12/12 showed almost complete clearing of the pneumonia.

## 2013-05-10 ENCOUNTER — Emergency Department (HOSPITAL_COMMUNITY): Payer: Medicare Other

## 2013-05-10 ENCOUNTER — Emergency Department (HOSPITAL_COMMUNITY)
Admission: EM | Admit: 2013-05-10 | Discharge: 2013-05-10 | Disposition: A | Discharge status: Home / Self Care | Payer: Medicare Other | Attending: Emergency Medicine | Admitting: Emergency Medicine

## 2013-05-10 ENCOUNTER — Encounter (HOSPITAL_COMMUNITY): Payer: Self-pay | Admitting: Emergency Medicine

## 2013-05-10 DIAGNOSIS — I5033 Acute on chronic diastolic (congestive) heart failure: Secondary | ICD-10-CM | POA: Insufficient documentation

## 2013-05-10 DIAGNOSIS — M545 Low back pain, unspecified: Secondary | ICD-10-CM | POA: Insufficient documentation

## 2013-05-10 DIAGNOSIS — Z79899 Other long term (current) drug therapy: Secondary | ICD-10-CM | POA: Insufficient documentation

## 2013-05-10 DIAGNOSIS — E039 Hypothyroidism, unspecified: Secondary | ICD-10-CM | POA: Insufficient documentation

## 2013-05-10 DIAGNOSIS — M549 Dorsalgia, unspecified: Secondary | ICD-10-CM

## 2013-05-10 DIAGNOSIS — Z880 Allergy status to penicillin: Secondary | ICD-10-CM | POA: Insufficient documentation

## 2013-05-10 DIAGNOSIS — M25559 Pain in unspecified hip: Secondary | ICD-10-CM | POA: Insufficient documentation

## 2013-05-10 DIAGNOSIS — I4891 Unspecified atrial fibrillation: Secondary | ICD-10-CM | POA: Insufficient documentation

## 2013-05-10 DIAGNOSIS — Z7901 Long term (current) use of anticoagulants: Secondary | ICD-10-CM | POA: Insufficient documentation

## 2013-05-10 DIAGNOSIS — Z853 Personal history of malignant neoplasm of breast: Secondary | ICD-10-CM | POA: Insufficient documentation

## 2013-05-10 DIAGNOSIS — I10 Essential (primary) hypertension: Secondary | ICD-10-CM | POA: Insufficient documentation

## 2013-05-10 MED ORDER — NAPROXEN 500 MG PO TABS: 500.0000 mg | ORAL_TABLET | Freq: Two times a day (BID) | ORAL | Status: DC

## 2013-05-10 NOTE — ED Notes (Signed)
Pt reports mid lower back pain and left hip pain, denies injury, no hx of back pain. Ambulatory at triage with walker. Denies urinary symptoms.

## 2013-05-10 NOTE — Discharge Instructions (Signed)
Back Pain, Adult Low back pain is very common. About 1 in 5 people have back pain.The cause of low back pain is rarely dangerous. The pain often gets better over time.About half of people with a sudden onset of back pain feel better in just 2 weeks. About 8 in 10 people feel better by 6 weeks.  CAUSES Some common causes of back pain include:  Strain of the muscles or ligaments supporting the spine.  Wear and tear (degeneration) of the spinal discs.  Arthritis.  Direct injury to the back. DIAGNOSIS Most of the time, the direct cause of low back pain is not known.However, back pain can be treated effectively even when the exact cause of the pain is unknown.Answering your caregiver's questions about your overall health and symptoms is one of the most accurate ways to make sure the cause of your pain is not dangerous. If your caregiver needs more information, he or she may order lab work or imaging tests (X-rays or MRIs).However, even if imaging tests show changes in your back, this usually does not require surgery. HOME CARE INSTRUCTIONS For many people, back pain returns.Since low back pain is rarely dangerous, it is often a condition that people can learn to manageon their own.   Remain active. It is stressful on the back to sit or stand in one place. Do not sit, drive, or stand in one place for more than 30 minutes at a time. Take short walks on level surfaces as soon as pain allows.Try to increase the length of time you walk each day.  Do not stay in bed.Resting more than 1 or 2 days can delay your recovery.  Do not avoid exercise or work.Your body is made to move.It is not dangerous to be active, even though your back may hurt.Your back will likely heal faster if you return to being active before your pain is gone.  Pay attention to your body when you bend and lift. Many people have less discomfortwhen lifting if they bend their knees, keep the load close to their bodies,and  avoid twisting. Often, the most comfortable positions are those that put less stress on your recovering back.  Find a comfortable position to sleep. Use a firm mattress and lie on your side with your knees slightly bent. If you lie on your back, put a pillow under your knees.  Only take over-the-counter or prescription medicines as directed by your caregiver. Over-the-counter medicines to reduce pain and inflammation are often the most helpful.Your caregiver may prescribe muscle relaxant drugs.These medicines help dull your pain so you can more quickly return to your normal activities and healthy exercise.  Put ice on the injured area.  Put ice in a plastic bag.  Place a towel between your skin and the bag.  Leave the ice on for 15-20 minutes, 03-04 times a day for the first 2 to 3 days. After that, ice and heat may be alternated to reduce pain and spasms.  Ask your caregiver about trying back exercises and gentle massage. This may be of some benefit.  Avoid feeling anxious or stressed.Stress increases muscle tension and can worsen back pain.It is important to recognize when you are anxious or stressed and learn ways to manage it.Exercise is a great option. SEEK MEDICAL CARE IF:  You have pain that is not relieved with rest or medicine.  You have pain that does not improve in 1 week.  You have new symptoms.  You are generally not feeling well. SEEK   IMMEDIATE MEDICAL CARE IF:   You have pain that radiates from your back into your legs.  You develop new bowel or bladder control problems.  You have unusual weakness or numbness in your arms or legs.  You develop nausea or vomiting.  You develop abdominal pain.  You feel faint. Document Released: 02/27/2005 Document Revised: 08/29/2011 Document Reviewed: 07/18/2010 [employer] Patient Information 2014 [employer].  

## 2013-05-10 NOTE — ED Provider Notes (Signed)
Patient presents emergency room with complaints of mid lower back pain left hip pain. The pain increases with movement. She denies any numbness or weakness. Fever. No abdominal pain Physical Exam  BP 117/59  Pulse 62  Temp(Src) 97.5 F (36.4 C) (Oral)  Wt 100 lb (45.36 kg)  SpO2 95%  Physical Exam  Nursing note and vitals reviewed. Constitutional: She appears well-developed and well-nourished. No distress.  HENT:  Head: Normocephalic and atraumatic.  Right Ear: External ear normal.  Left Ear: External ear normal.  Eyes: Conjunctivae are normal. Right eye exhibits no discharge. Left eye exhibits no discharge. No scleral icterus.  Neck: Neck supple. No tracheal deviation present.  Cardiovascular: Normal rate.   Pulmonary/Chest: Effort normal. No stridor. No respiratory distress.  Musculoskeletal: She exhibits no edema.       Lumbar back: She exhibits tenderness and bony tenderness. She exhibits no swelling, no edema and no deformity.  Neurological: She is alert. Cranial nerve deficit: no gross deficits.  Normal sensation in her lower remedies, normal strength  Skin: Skin is warm and dry. No rash noted. She is not diaphoretic.  Psychiatric: She has a normal mood and affect.    ED Course  Procedures  MDM Consistent with musculoskeletal pain.  DC home with pain medications.  Outpatient follow up.    I saw and evaluated the patient, reviewed the resident's note and I agree with the findings and plan.   Kathalene Frames, MD 05/12/13 [phone]

## 2013-05-10 NOTE — ED Notes (Signed)
Patient discharged to home with family. NAD.  

## 2013-05-10 NOTE — ED Provider Notes (Signed)
CSN: [id number]     Arrival date & time 05/10/13  1456 History   First MD Initiated Contact with Patient 05/10/13 1518     Chief Complaint  Patient presents with  . Back Pain   HPI Comments: 80 yo F hx of atrial fib on coumadin, hypothyroidism, HTN, diastolic CHF, breast CA presents with CC of back pain.  Symptoms started 3-4 days ago.  Pain gradually worsening.  Described as sharp pain, across entire lower back, and radiates into left hip.  Pain is worse with moving, bending.  Pain gets better with rest.  Denies fever, chills, CP, SOB, cough, abdominal pain, nausea, vomiting, diarrhea, constipation, dysuria, paresthesias, weakness.  Pt has no difficulty ambulating, and does so with a cane which is her baseline.  Pt went to see PCP yesterday, as she thought she may have Shingles.  Pt was prescribed Naproxen for pain, with some benefit.   Patient is a 80 y.o. female presenting with back pain. The history is provided by the patient. No language interpreter was used.  Back Pain Location:  Lumbar spine Quality:  Stabbing Radiates to: left hip. Pain severity:  Mild Onset quality:  Gradual Duration:  4 days Timing:  Intermittent Progression:  Unchanged Chronicity:  New Context: not emotional stress, not falling, not jumping from heights, not lifting heavy objects, not MCA, not MVA, not occupational injury, not pedestrian accident, not physical stress, not recent illness, not recent injury and not twisting   Relieved by:  Being still and NSAIDs Worsened by:  Bending and movement Ineffective treatments:  None tried Associated symptoms: no abdominal pain, no abdominal swelling, no bladder incontinence, no bowel incontinence, no chest pain, no dysuria, no fever, no headaches, no leg pain, no numbness, no paresthesias, no pelvic pain, no perianal numbness, no tingling, no weakness and no weight loss   Risk factors: hx of cancer   Risk factors: no hx of osteoporosis, no lack of exercise, no menopause,  not obese, not pregnant, no recent surgery, no steroid use and no vascular disease     Past Medical History  Diagnosis Date  . Atrial fibrillation with controlled ventricular response   . Hypothyroidism   . Hypertension   . Diastolic CHF, acute on chronic   . Valvular heart disease     with severe mr,tr,and mod pulmonary regurgitation  . Breast cancer    Past Surgical History  Procedure Laterality Date  . Mastectomy    . Oopherectomy    . Appendectomy    . Cholecystectomy     History reviewed. No pertinent family history. History  Substance Use Topics  . Smoking status: Never Smoker   . Smokeless tobacco: Not on file  . Alcohol Use: No   OB History   Grav Para Term Preterm Abortions TAB SAB Ect Mult Living                 Review of Systems  Constitutional: Negative for fever, chills and weight loss.  Respiratory: Negative for cough and shortness of breath.   Cardiovascular: Negative for chest pain, palpitations and leg swelling.  Gastrointestinal: Negative for nausea, vomiting, abdominal pain, diarrhea and bowel incontinence.  Genitourinary: Negative for bladder incontinence, dysuria and pelvic pain.  Musculoskeletal: Positive for back pain. Negative for arthralgias and gait problem.       L hip pain.  Skin: Negative for rash.  Neurological: Negative for dizziness, tingling, facial asymmetry, weakness, light-headedness, numbness, headaches and paresthesias.  Hematological: Negative for adenopathy. Does not  bruise/bleed easily.  All other systems reviewed and are negative.      Allergies  Famotidine; Penicillins; Sulfa antibiotics; and Tetracyclines & related  Home Medications   Current Outpatient Rx  Name  Route  Sig  Dispense  Refill  . chlordiazePOXIDE (LIBRIUM) 5 MG capsule   Oral   Take 1 capsule (5 mg total) by mouth daily as needed. For nerves   30 capsule   5   . digoxin (LANOXIN) 0.125 MG tablet   Oral   Take 1 tablet (125 mcg total) by mouth  daily.   90 tablet   3   . diltiazem (CARDIZEM CD) 300 MG 24 hr capsule   Oral   Take 1 capsule (300 mg total) by mouth daily.   90 capsule   3   . dorzolamide (TRUSOPT) 2 % ophthalmic solution   Both Eyes   Place 1 drop into both eyes 2 (two) times daily.          . dorzolamide-timolol (COSOPT) 22.3-6.8 MG/ML ophthalmic solution               . ferrous sulfate 325 (65 FE) MG tablet   Oral   Take 325 mg by mouth daily with breakfast.         . furosemide (LASIX) 40 MG tablet   Oral   Take 40 mg by mouth as directed. 1 and 1/2 tablet daily         . guaiFENesin-dextromethorphan (ROBITUSSIN DM) 100-10 MG/5ML syrup   Oral   Take 5 mLs by mouth every 4 (four) hours as needed for cough.   118 mL   0   . isosorbide mononitrate (IMDUR) 60 MG 24 hr tablet   Oral   Take 1 tablet (60 mg total) by mouth daily.   90 tablet   3   . latanoprost (XALATAN) 0.005 % ophthalmic solution   Both Eyes   Place 1 drop into both eyes at bedtime.          Marland Kitchen levothyroxine (SYNTHROID, LEVOTHROID) 100 MCG tablet   Oral   Take 1 tablet (100 mcg total) by mouth daily.   93 tablet   3   . LUTEIN PO   Oral   Take 6-20 mg by mouth daily.         . metoprolol succinate (TOPROL-XL) 100 MG 24 hr tablet      Take 1/2 tablet by mouth twice daily.   90 tablet   3   . Multiple Vitamin (MULTIVITAMIN) tablet   Oral   Take 1 tablet by mouth daily.         . nitroGLYCERIN (NITROSTAT) 0.4 MG SL tablet   Sublingual   Place 0.4 mg under the tongue every 5 (five) minutes as needed. For chest pains          . potassium chloride SA (K-DUR,KLOR-CON) 20 MEQ tablet   Oral   Take 1 tablet (20 mEq total) by mouth every other day.   90 tablet   3   . vitamin C (ASCORBIC ACID) 500 MG tablet   Oral   Take 500 mg by mouth daily.         . vitamin E 200 UNIT capsule   Oral   Take 200 Units by mouth daily.         Marland Kitchen warfarin (COUMADIN) 5 MG tablet      Take as directed by  coumadin clinic   30 tablet   3  BP 117/59  Pulse 62  Temp(Src) 97.5 F (36.4 C) (Oral)  Wt 100 lb (45.36 kg)  SpO2 95% Physical Exam  Nursing note and vitals reviewed. Constitutional: She is oriented to person, place, and time. She appears well-developed and well-nourished.  HENT:  Head: Normocephalic and atraumatic.  Right Ear: External ear normal.  Left Ear: External ear normal.  Mouth/Throat: Oropharynx is clear and moist.  Eyes: Conjunctivae and EOM are normal. Pupils are equal, round, and reactive to light.  Neck: Normal range of motion. Neck supple.  Cardiovascular: Normal rate, regular rhythm, normal heart sounds and intact distal pulses.   2+ femoral, PT, DP pulses bilaterally.  Pulmonary/Chest: Effort normal and breath sounds normal. No respiratory distress. She has no wheezes. She has no rales. She exhibits no tenderness.  Abdominal: Soft. Bowel sounds are normal. She exhibits no distension and no mass. There is no tenderness. There is no rebound and no guarding.  Musculoskeletal: Normal range of motion. She exhibits tenderness.  Mild left iliac crest TTP, mild left lumbar paraspinal TTP.  No deformity, no bruising, or signs of external trauma.  Pt has good ROM of back, and BLE.  Negative straight leg test.    Neurological: She is alert and oriented to person, place, and time.  No saddle anesthesia.  No focal sensory or motor deficits globally.  Pt ambulates with cane without difficulty.  Skin: Skin is warm and dry. No rash noted.    ED Course  Procedures (including critical care time) Labs Review Labs Reviewed - No data to display Imaging Review Dg Lumbar Spine Complete  05/10/2013   CLINICAL DATA:  Back pain  EXAM: LUMBAR SPINE - COMPLETE 4+ VIEW  COMPARISON:  09/04/2012  FINDINGS: Five views of lumbar spine submitted. Again noted significant disc space flattening with endplate sclerotic changes at L1-L2, L2-L3, L3-L4 and L4-L5 level. Mild disc space flattening at  L5-S1 level again noted anterior spurring at L2-L3, L3-L4 and L4-L5 level. Atherosclerotic calcifications of abdominal aorta and iliac arteries. There is mild levoscoliosis again noted. No acute fracture or subluxation.  IMPRESSION: No acute fracture or subluxation. Again noted multilevel degenerative changes as described above.   Electronically Signed   By: Lahoma Crocker M.D.   On: 05/10/2013 16:53   Dg Pelvis 1-2 Views  05/10/2013   CLINICAL DATA:  Back pain, left hip pain  EXAM: PELVIS - 1-2 VIEW  COMPARISON:  None.  FINDINGS: Single frontal view of the pelvis submitted. No acute fracture or subluxation. Mild degenerative changes bilateral hip joint with narrowing of superior hip joint space. Mild degenerative changes pubic symphysis.  IMPRESSION: No acute fracture or subluxation.  Mild degenerative changes.   Electronically Signed   By: Lahoma Crocker M.D.   On: 05/10/2013 16:54     EKG Interpretation None      MDM   Final diagnoses:  None   80 yo F hx of atrial fib on coumadin, hypothyroidism, HTN, diastolic CHF, breast CA presents with CC of back pain.    Filed Vitals:   05/10/13 1506  BP: 117/59  Pulse: 62  Temp: 97.5 F (36.4 C)   Physical exam as above.  VS WNL.  Pt endorses low back, left hip pain with movement.  Some TTP of left paraspinal muscles in lumbar area, and left iliac crest.  Femoral, PT, DP pulses 2+, equal bilaterally.  No neurologic deficits on exam.  Pt ambulates with cane without difficulty.  XR of lumbar spine, and pelvis demonstrate  degenerative changes, but no acute fracture or subluxation.    Diagnosis of low pain, pelvic pain, likely 2/2 arthritis.   Pt to be d/c home in good condition.  Encouraged to continue supportive care.  Rx Naproxen.  F/u with PCP in 1 week.  Return precautions given.  Fall precautions given.  Pt understands and agrees with plan.  I have discussed pt's care plan with Dr. Tomi Bamberger.  Sinda Du, MD      Sinda Du, MD 05/11/13 [phone]

## 2013-05-27 ENCOUNTER — Other Ambulatory Visit: Payer: Self-pay

## 2013-05-27 MED ORDER — ISOSORBIDE MONONITRATE ER 60 MG PO TB24: 60.0000 mg | ORAL_TABLET | Freq: Every day | ORAL | Status: DC

## 2013-05-27 MED ORDER — FUROSEMIDE 40 MG PO TABS: 40.0000 mg | ORAL_TABLET | ORAL | Status: DC

## 2013-05-28 ENCOUNTER — Other Ambulatory Visit: Payer: Self-pay | Admitting: *Deleted

## 2013-05-28 MED ORDER — METOPROLOL SUCCINATE ER 100 MG PO TB24: ORAL_TABLET | ORAL | Status: DC

## 2013-05-29 ENCOUNTER — Encounter (HOSPITAL_COMMUNITY): Payer: Self-pay | Admitting: Emergency Medicine

## 2013-05-29 ENCOUNTER — Telehealth: Payer: Self-pay | Admitting: Cardiology

## 2013-05-29 ENCOUNTER — Emergency Department (HOSPITAL_COMMUNITY)
Admission: EM | Admit: 2013-05-29 | Discharge: 2013-05-29 | Disposition: A | Discharge status: Home / Self Care | Payer: Medicare Other | Attending: Emergency Medicine | Admitting: Emergency Medicine

## 2013-05-29 ENCOUNTER — Emergency Department (HOSPITAL_COMMUNITY): Payer: Medicare Other

## 2013-05-29 DIAGNOSIS — M549 Dorsalgia, unspecified: Secondary | ICD-10-CM

## 2013-05-29 DIAGNOSIS — Z853 Personal history of malignant neoplasm of breast: Secondary | ICD-10-CM | POA: Insufficient documentation

## 2013-05-29 DIAGNOSIS — Z791 Long term (current) use of non-steroidal anti-inflammatories (NSAID): Secondary | ICD-10-CM | POA: Insufficient documentation

## 2013-05-29 DIAGNOSIS — M7989 Other specified soft tissue disorders: Secondary | ICD-10-CM

## 2013-05-29 DIAGNOSIS — I5033 Acute on chronic diastolic (congestive) heart failure: Secondary | ICD-10-CM | POA: Insufficient documentation

## 2013-05-29 DIAGNOSIS — M8448XA Pathological fracture, other site, initial encounter for fracture: Secondary | ICD-10-CM | POA: Insufficient documentation

## 2013-05-29 DIAGNOSIS — I10 Essential (primary) hypertension: Secondary | ICD-10-CM | POA: Insufficient documentation

## 2013-05-29 DIAGNOSIS — IMO0002 Reserved for concepts with insufficient information to code with codable children: Secondary | ICD-10-CM | POA: Insufficient documentation

## 2013-05-29 DIAGNOSIS — Z7901 Long term (current) use of anticoagulants: Secondary | ICD-10-CM | POA: Insufficient documentation

## 2013-05-29 DIAGNOSIS — I4891 Unspecified atrial fibrillation: Secondary | ICD-10-CM | POA: Insufficient documentation

## 2013-05-29 DIAGNOSIS — Z79899 Other long term (current) drug therapy: Secondary | ICD-10-CM | POA: Insufficient documentation

## 2013-05-29 DIAGNOSIS — S32010A Wedge compression fracture of first lumbar vertebra, initial encounter for closed fracture: Secondary | ICD-10-CM

## 2013-05-29 DIAGNOSIS — Z880 Allergy status to penicillin: Secondary | ICD-10-CM | POA: Insufficient documentation

## 2013-05-29 DIAGNOSIS — M79631 Pain in right forearm: Secondary | ICD-10-CM

## 2013-05-29 DIAGNOSIS — N859 Noninflammatory disorder of uterus, unspecified: Secondary | ICD-10-CM | POA: Insufficient documentation

## 2013-05-29 DIAGNOSIS — E039 Hypothyroidism, unspecified: Secondary | ICD-10-CM | POA: Insufficient documentation

## 2013-05-29 DIAGNOSIS — L03113 Cellulitis of right upper limb: Secondary | ICD-10-CM

## 2013-05-29 LAB — BASIC METABOLIC PANEL WITH GFR
BUN: 17 mg/dL (ref 6–23)
CO2: 25 meq/L (ref 19–32)
Calcium: 8.8 mg/dL (ref 8.4–10.5)
Chloride: 94 meq/L — ABNORMAL LOW (ref 96–112)
Creatinine, Ser: 0.8 mg/dL (ref 0.50–1.10)
GFR calc Af Amer: 70 mL/min — ABNORMAL LOW (ref >90)
GFR calc non Af Amer: 60 mL/min — ABNORMAL LOW (ref >90)
Glucose, Bld: 146 mg/dL — ABNORMAL HIGH (ref 70–99)
Potassium: 3.8 meq/L (ref 3.7–5.3)
Sodium: 134 meq/L — ABNORMAL LOW (ref 137–147)

## 2013-05-29 LAB — PROTIME-INR
INR: 2.09 — ABNORMAL HIGH (ref 0.00–1.49)
Prothrombin Time: 22.8 s — ABNORMAL HIGH (ref 11.6–15.2)

## 2013-05-29 LAB — CBC
HCT: 29.1 % — ABNORMAL LOW (ref 36.0–46.0)
Hemoglobin: 9.6 g/dL — ABNORMAL LOW (ref 12.0–15.0)
MCH: 30.2 pg (ref 26.0–34.0)
MCHC: 33 g/dL (ref 30.0–36.0)
MCV: 91.5 fL (ref 78.0–100.0)
Platelets: 377 K/uL (ref 150–400)
RBC: 3.18 MIL/uL — ABNORMAL LOW (ref 3.87–5.11)
RDW: 15.9 % — ABNORMAL HIGH (ref 11.5–15.5)
WBC: 14.3 K/uL — ABNORMAL HIGH (ref 4.0–10.5)

## 2013-05-29 MED ORDER — DOXYCYCLINE HYCLATE 100 MG PO CAPS: 100.0000 mg | ORAL_CAPSULE | Freq: Two times a day (BID) | ORAL | Status: DC

## 2013-05-29 MED ORDER — CEPHALEXIN 250 MG PO CAPS
500.0000 mg | ORAL_CAPSULE | Freq: Once | ORAL | Status: AC
  Administered 2013-05-29: 500 mg via ORAL
  Filled 2013-05-29: qty 2

## 2013-05-29 MED ORDER — CEPHALEXIN 500 MG PO CAPS: 500.0000 mg | ORAL_CAPSULE | Freq: Four times a day (QID) | ORAL | Status: DC

## 2013-05-29 MED ORDER — DOXYCYCLINE HYCLATE 100 MG PO TABS
100.0000 mg | ORAL_TABLET | Freq: Once | ORAL | Status: AC
  Administered 2013-05-29: 100 mg via ORAL
  Filled 2013-05-29: qty 1

## 2013-05-29 MED ORDER — TRAMADOL HCL 50 MG PO TABS: 50.0000 mg | ORAL_TABLET | Freq: Four times a day (QID) | ORAL | Status: DC | PRN

## 2013-05-29 NOTE — Discharge Instructions (Signed)
Back, Compression Fracture °A compression fracture happens when a force is put upon the length of your spine. Slipping and falling on your bottom are examples of such a force. When this happens, sometimes the force is great enough to compress the building blocks (vertebral bodies) of your spine. Although this causes a lot of pain, this can usually be treated at home, unless your caregiver feels hospitalization is needed for pain control. °Your backbone (spinal column) is made up of 24 main vertebral bodies in addition to the sacrum and coccyx (see illustration). These are held together by tough fibrous tissues (ligaments) and by support of your muscles. Nerve roots pass through the openings between the vertebrae. A sudden wrenching move, injury, or a fall may cause a compression fracture of one of the vertebral bodies. This may result in back pain or spread of pain into the belly (abdomen), the buttocks, and down the leg into the foot. Pain may also be created by muscle spasm alone. °Large studies have been undertaken to determine the best possible course of action to help your back following injury and also to prevent future problems. The recommendations are as follows. °FOLLOWING A COMPRESSION FRACTURE: °Do the following only if advised by your caregiver.  °· If a back brace has been suggested or provided, wear it as directed. °· DO NOT stop wearing the back brace unless instructed by your caregiver. °· When allowed to return to regular activities, avoid a sedentary life style. Actively exercise. Sporadic weekend binges of tennis, racquetball, water skiing, may actually aggravate or create problems, especially if you are not in condition for that activity. °· Avoid sports requiring sudden body movements until you are in condition for them. Swimming and walking are safer activities. °· Maintain good posture. °· Avoid obesity. °· If not already done, you should have a DEXA scan. Based on the results, be treated for  osteoporosis. °FOLLOWING ACUTE (SUDDEN) INJURY: °· Only take over-the-counter or prescription medicines for pain, discomfort, or fever as directed by your caregiver. °· Use bed rest for only the most extreme acute episode. Prolonged bed rest may aggravate your condition. Ice used for acute conditions is effective. Use a large plastic bag filled with ice. Wrap it in a towel. This also provides excellent pain relief. This may be continuous. Or use it for 30 minutes every 2 hours during acute phase, then as needed. Heat for 30 minutes prior to activities is helpful. °· As soon as the acute phase (the time when your back is too painful for you to do normal activities) is over, it is important to resume normal activities and work hardening programs. Back injuries can cause potentially marked changes in lifestyle. So it is important to attack these problems aggressively. °· See your caregiver for continued problems. He or she can help or refer you for appropriate exercises, physical therapy and work hardening if needed. °· If you are given narcotic medications for your condition, for the next 24 hours DO NOT: °· Drive °· Operate machinery or power tools. °· Sign legal documents. °· DO NOT drink alcohol, take sleeping pills or other medications that may interfere with treatment. °If your caregiver has given you a follow-up appointment, it is very important to keep that appointment. Not keeping the appointment could result in a chronic or permanent injury, pain, and disability. If there is any problem keeping the appointment, you must call back to this facility for assistance.  °SEEK IMMEDIATE MEDICAL CARE IF: °· You develop numbness,   tingling, weakness, or problems with the use of your arms or legs.  You develop severe back pain not relieved with medications.  You have changes in bowel or bladder control.  You have increasing pain in any areas of the body. Document Released: 02/27/2005 Document Revised: 05/22/2011  Document Reviewed: 10/02/2007 [facility] Patient Information 2014 [place].  Cellulitis Cellulitis is an infection of the skin and the tissue beneath it. The infected area is usually red and tender. Cellulitis occurs most often in the arms and lower legs.  CAUSES  Cellulitis is caused by bacteria that enter the skin through cracks or cuts in the skin. The most common types of bacteria that cause cellulitis are Staphylococcus and Streptococcus. SYMPTOMS   Redness and warmth.  Swelling.  Tenderness or pain.  Fever. DIAGNOSIS  Your caregiver can usually determine what is wrong based on a physical exam. Blood tests may also be done. TREATMENT  Treatment usually involves taking an antibiotic medicine. HOME CARE INSTRUCTIONS   Take your antibiotics as directed. Finish them even if you start to feel better.  Keep the infected arm or leg elevated to reduce swelling.  Apply a warm cloth to the affected area up to 4 times per day to relieve pain.  Only take over-the-counter or prescription medicines for pain, discomfort, or fever as directed by your caregiver.  Keep all follow-up appointments as directed by your caregiver. SEEK MEDICAL CARE IF:   You notice red streaks coming from the infected area.  Your red area gets larger or turns dark in color.  Your bone or joint underneath the infected area becomes painful after the skin has healed.  Your infection returns in the same area or another area.  You notice a swollen bump in the infected area.  You develop new symptoms. SEEK IMMEDIATE MEDICAL CARE IF:   You have a fever.  You feel very sleepy.  You develop vomiting or diarrhea.  You have a general ill feeling (malaise) with muscle aches and pains. MAKE SURE YOU:   Understand these instructions.  Will watch your condition.  Will get help right away if you are not doing well or get worse. Document Released: 12/07/2004 Document Revised: 08/29/2011 Document  Reviewed: 05/15/2011 [facility] Patient Information 2014 [place].

## 2013-05-29 NOTE — Telephone Encounter (Signed)
Left message to call back  

## 2013-05-29 NOTE — ED Provider Notes (Addendum)
CSN: [id number]     Arrival date & time 05/29/13  1700 History   First MD Initiated Contact with Patient 05/29/13 1827     Chief Complaint  Patient presents with  . Back Pain     (Consider location/radiation/quality/duration/timing/severity/associated sxs/prior Treatment) Patient is a 80 y.o. female presenting with back pain. The history is provided by the patient.  Back Pain Location:  Lumbar spine Quality:  Aching Radiates to: L hip. Pain severity:  Moderate Pain is:  Same all the time Onset quality:  Gradual Duration:  3 weeks Timing:  Constant Progression:  Unchanged Chronicity:  New Context: not emotional stress, not falling and not twisting   Relieved by:  Nothing Worsened by:  Nothing tried Associated symptoms: no abdominal pain and no fever     Past Medical History  Diagnosis Date  . Atrial fibrillation with controlled ventricular response   . Hypothyroidism   . Hypertension   . Diastolic CHF, acute on chronic   . Valvular heart disease     with severe mr,tr,and mod pulmonary regurgitation  . Breast cancer    Past Surgical History  Procedure Laterality Date  . Mastectomy    . Oopherectomy    . Appendectomy    . Cholecystectomy     No family history on file. History  Substance Use Topics  . Smoking status: Never Smoker   . Smokeless tobacco: Not on file  . Alcohol Use: No   OB History   Grav Para Term Preterm Abortions TAB SAB Ect Mult Living                 Review of Systems  Constitutional: Negative for fever and chills.  Respiratory: Negative for cough and shortness of breath.   Gastrointestinal: Negative for vomiting and abdominal pain.  Musculoskeletal: Positive for back pain.  All other systems reviewed and are negative.      Allergies  Famotidine; Penicillins; Sulfa antibiotics; and Tetracyclines & related  Home Medications   Current Outpatient Rx  Name  Route  Sig  Dispense  Refill  . chlordiazePOXIDE (LIBRIUM) 5 MG capsule    Oral   Take 1 capsule (5 mg total) by mouth daily as needed. For nerves   30 capsule   5   . digoxin (LANOXIN) 0.125 MG tablet   Oral   Take 1 tablet (125 mcg total) by mouth daily.   90 tablet   3   . diltiazem (CARDIZEM CD) 300 MG 24 hr capsule   Oral   Take 1 capsule (300 mg total) by mouth daily.   90 capsule   3   . dorzolamide (TRUSOPT) 2 % ophthalmic solution   Both Eyes   Place 1 drop into both eyes 2 (two) times daily.          . dorzolamide-timolol (COSOPT) 22.3-6.8 MG/ML ophthalmic solution   Both Eyes   Place 1 drop into both eyes 2 (two) times daily.          . ferrous sulfate 325 (65 FE) MG tablet   Oral   Take 325 mg by mouth daily with breakfast.         . furosemide (LASIX) 40 MG tablet   Oral   Take 1 tablet (40 mg total) by mouth as directed.   30 tablet   10   . guaiFENesin-dextromethorphan (ROBITUSSIN DM) 100-10 MG/5ML syrup   Oral   Take 5 mLs by mouth every 4 (four) hours as needed for cough.  118 mL   0   . isosorbide mononitrate (IMDUR) 60 MG 24 hr tablet   Oral   Take 1 tablet (60 mg total) by mouth daily.   30 tablet   1   . latanoprost (XALATAN) 0.005 % ophthalmic solution   Both Eyes   Place 1 drop into both eyes at bedtime.          Marland Kitchen levothyroxine (SYNTHROID, LEVOTHROID) 100 MCG tablet   Oral   Take 1 tablet (100 mcg total) by mouth daily.   93 tablet   3   . LUTEIN PO   Oral   Take 6-20 mg by mouth daily.         . metoprolol succinate (TOPROL-XL) 100 MG 24 hr tablet   Oral   Take 50 mg by mouth daily. Take with or immediately following a meal.         . Multiple Vitamin (MULTIVITAMIN) tablet   Oral   Take 1 tablet by mouth daily.         . naproxen (NAPROSYN) 500 MG tablet   Oral   Take 500 mg by mouth 2 (two) times daily with a meal.         . nitroGLYCERIN (NITROSTAT) 0.4 MG SL tablet   Sublingual   Place 0.4 mg under the tongue every 5 (five) minutes as needed. For chest pains           . potassium chloride SA (K-DUR,KLOR-CON) 20 MEQ tablet   Oral   Take 1 tablet (20 mEq total) by mouth every other day.   90 tablet   3   . vitamin C (ASCORBIC ACID) 500 MG tablet   Oral   Take 500 mg by mouth daily.         . vitamin E 200 UNIT capsule   Oral   Take 200 Units by mouth daily.         Marland Kitchen warfarin (COUMADIN) 5 MG tablet   Oral   Take 2.5-5 mg by mouth every evening. Patient takes 2.5 mg every evening except on Monday patient takes 5 mg          BP 128/55  Pulse 67  Temp(Src) 97.1 F (36.2 C) (Oral)  Resp 18  SpO2 99% Physical Exam  Nursing note and vitals reviewed. Constitutional: She is oriented to person, place, and time. She appears well-developed and well-nourished. No distress.  HENT:  Head: Normocephalic and atraumatic.  Eyes: EOM are normal. Pupils are equal, round, and reactive to light.  Neck: Normal range of motion. Neck supple.  Cardiovascular: Normal rate and regular rhythm.  Exam reveals no friction rub.   No murmur heard. Pulmonary/Chest: Effort normal and breath sounds normal. No respiratory distress. She has no wheezes. She has no rales.  Abdominal: Soft. She exhibits no distension. There is no tenderness. There is no rebound.  Musculoskeletal: Normal range of motion. She exhibits no edema.       Right elbow: She exhibits swelling (mild proximal with no induration). She exhibits normal range of motion, no effusion, no deformity and no laceration. No tenderness found.       Lumbar back: She exhibits tenderness (paraspinal muscles bilaterally, no bony deformtiy or bony tenderness). She exhibits normal range of motion, no bony tenderness and no swelling.  Pitting edema of R forearm, 1+  Neurological: She is alert and oriented to person, place, and time.  Skin: No rash noted. She is not diaphoretic.    ED  Course  Procedures (including critical care time) Labs Review Labs Reviewed  PROTIME-INR  CBC  BASIC METABOLIC PANEL   Imaging  Review Ct Pelvis Wo Contrast  05/29/2013   CLINICAL DATA:  Left hip pain, back pain  EXAM: CT PELVIS WITHOUT CONTRAST  TECHNIQUE: Multidetector CT imaging of the pelvis was performed following the standard protocol without intravenous contrast. Oral contrast not administered. Sagittal and coronal MPR images reconstructed from axial data set.  COMPARISON:  09/04/2012  FINDINGS: Diffuse osseous demineralization.  SI joints symmetric.  Mild bilateral hip joint space narrowing slightly greater on left.  Degenerative disc disease changes lower lumbar spine with disc space narrowing and endplate spur formation.  Bony pelvis intact.  No femoral fracture or dislocation.  Unremarkable bladder in ureters.  Small atrophic uterus with uterine calcifications.  Scattered atherosclerotic calcification aorta.  Visualized bowel loops significant only for a few scattered colonic diverticula.  No intrapelvic mass, adenopathy or free fluid.  IMPRESSION: Osseous demineralization with degenerative disc disease changes lumbar spine.  No acute pelvic oral left hip osseous abnormalities.  Minimal sigmoid diverticulosis.  No acute intrapelvic abnormalities.   Electronically Signed   By: Lavonia Dana M.D.   On: 05/29/2013 20:31     EKG Interpretation None      Editor: Doyne Keel Simonetti (Cardiovascular Sonographer)      *Preliminary Results*  Right upper extremity venous duplex completed.  Right upper extremity is negative for deep and superficial vein thrombosis.  Preliminary results discussed with Dr.Varshini Arrants.    MDM   Final diagnoses:  Back pain  Pain and swelling of right forearm    56F presents with back pain. 3 weeks of back pain, no worsening. Has had pain meds given to her for this back pain. L spine xray 3 weeks ago without abnormality. No falls or trauam. Denies LE weakness, numbness, bladder/bowel incontinence/retention. On coumadin. No fevers, no N/V/D, no CP or SOB. Patient also has R arm swelling, began  yesterday, no trauma. Patient has no hx of clots, on warfarin for Afib. R elbow swollen without signs of cellulitis, normal elbow ROM, no effusion. Mild pitting edema in forearm.  [place] of R arm without evidence of DVT. Will have her f/u with her doctor for this R arm swelling. Pain for 3 weeks, stable pressures, all in lower back on exam. Doubt aortic dissection.  CT shows L1 compression fx, likely cause of her symptoms. With white count elevated. R arm with redness and warmth once examined while in a gown. Will treat with keflex, bactrim for cellulitis. Instructed to f/u with PCP.   Osvaldo Shipper, MD 05/29/13 2045  Osvaldo Shipper, MD 05/31/13 [phone]

## 2013-05-29 NOTE — Progress Notes (Signed)
*  Preliminary Results* Right upper extremity venous duplex completed. Right upper extremity is negative for deep and superficial vein thrombosis.  Preliminary results discussed with Dr.Walden.  05/29/2013 7:21 PM  Maudry Mayhew, RVT, RDCS, RDMS

## 2013-05-29 NOTE — Telephone Encounter (Signed)
New message  Patients son called. He is concerned due to back pain and swelling of arm. He feels she is losing her mind as well. She's been seeing Dr Welton Flakes (PCP) for coumadin checks. Son would like a order for his mother to be placed in hospice. Please call and advise.

## 2013-05-29 NOTE — ED Notes (Signed)
Patient was given oral antibiotic that she states she does not remember if she had a reaction to before or not. Keeping patient to observe for any reaction. If non noted she will be discharged home.

## 2013-05-29 NOTE — ED Notes (Signed)
Son st's pt has been c/o back pain x's 3 weeks.  St's pt has been seen 2-3 times by  Her MD Dr. Welton Flakes and given different kinds of pain meds.  Pt continues to c/o pain and now has swelling in right arm.  Pt denies any injury.  Son also st's pt has been confused

## 2013-05-30 ENCOUNTER — Other Ambulatory Visit: Payer: Self-pay | Admitting: *Deleted

## 2013-05-30 DIAGNOSIS — E039 Hypothyroidism, unspecified: Secondary | ICD-10-CM

## 2013-05-30 MED ORDER — POTASSIUM CHLORIDE CRYS ER 20 MEQ PO TBCR: 20.0000 meq | EXTENDED_RELEASE_TABLET | ORAL | Status: DC

## 2013-05-30 MED ORDER — DIGOXIN 125 MCG PO TABS: 125.0000 ug | ORAL_TABLET | Freq: Every day | ORAL | Status: DC

## 2013-05-30 MED ORDER — DILTIAZEM HCL ER COATED BEADS 300 MG PO CP24: 300.0000 mg | ORAL_CAPSULE | Freq: Every day | ORAL | Status: DC

## 2013-05-30 MED ORDER — LEVOTHYROXINE SODIUM 100 MCG PO TABS: 100.0000 ug | ORAL_TABLET | Freq: Every day | ORAL | Status: DC

## 2013-06-02 ENCOUNTER — Telehealth: Payer: Self-pay | Admitting: Cardiology

## 2013-06-02 NOTE — Telephone Encounter (Signed)
Patient seen in ED. 

## 2013-06-02 NOTE — Telephone Encounter (Signed)
Explained to son that he would need to find an assisted living facility and a physician does not have to recommend but may have to fill out paper work. Son stated I answered his questions and thanked me

## 2013-06-02 NOTE — Telephone Encounter (Signed)
New message  Patient is not doing well at all, he would like assistance with assisted living or help with in house home care. Please call and advise.

## 2013-06-04 ENCOUNTER — Encounter: Payer: Self-pay | Admitting: Cardiology

## 2013-06-04 ENCOUNTER — Other Ambulatory Visit: Payer: Self-pay | Admitting: *Deleted

## 2013-06-04 DIAGNOSIS — F419 Anxiety disorder, unspecified: Secondary | ICD-10-CM

## 2013-06-04 MED ORDER — CHLORDIAZEPOXIDE HCL 5 MG PO CAPS: 5.0000 mg | ORAL_CAPSULE | Freq: Every day | ORAL | Status: DC | PRN

## 2013-06-06 ENCOUNTER — Encounter: Payer: Self-pay | Admitting: Cardiology

## 2013-06-11 ENCOUNTER — Telehealth: Payer: Self-pay | Admitting: *Deleted

## 2013-06-11 NOTE — Telephone Encounter (Signed)
From: Otho Perl Sent: 06/06/2013 10:59 PM To: [facility] Triage Subject: Non-Urgent Medical Question I am updating my Mothers Med. Please remove CephALEXIN- she has taken all. doxycycline- taken all.. traMADol - on other pain med. naproxen- 0n other pain med guaiFENesin- quit a long time ago dorzolamide- taking other.. More to come later... Thanks Roy  Removed as requested

## 2013-06-18 ENCOUNTER — Telehealth: Payer: Self-pay

## 2013-06-18 ENCOUNTER — Other Ambulatory Visit: Payer: Self-pay

## 2013-06-18 MED ORDER — METOPROLOL SUCCINATE ER 100 MG PO TB24: ORAL_TABLET | ORAL | Status: DC

## 2013-06-19 ENCOUNTER — Other Ambulatory Visit: Payer: Self-pay

## 2013-06-19 MED ORDER — METOPROLOL SUCCINATE ER 100 MG PO TB24: ORAL_TABLET | ORAL | Status: DC

## 2013-06-19 NOTE — Telephone Encounter (Signed)
Left message to call back -  ---- Message from [name] sent at 06/18/2013 2:54 PM ----- PATIENT'S SON CALLED STATING THAT HIS MOTHER WAS ON A FEW BLOOD PRESSURE MEDS AND THAT HE KNEW THEY WERE FOR OTHER DIAGNOSIS AND WOULD LIKE TO KNOW IF SHE NEEDED ALL HER MEDS OR IF THEY COULD STOP SOME OF THEM .PLEASE ADVISE GIVE HIM A CALL BACK AT [phone] [facility]

## 2013-06-20 ENCOUNTER — Telehealth: Payer: Self-pay | Admitting: Cardiology

## 2013-06-20 NOTE — Telephone Encounter (Signed)
Follow UP:  Returning call to nurse

## 2013-06-20 NOTE — Telephone Encounter (Signed)
Son has started to help her with medications and would like  Dr. Mare Ferrari to review them to make sure she needs to be on all of them. Will forward to him for review.

## 2013-06-20 NOTE — Telephone Encounter (Signed)
New message     Returning [name]'s call. Need to talk to you before you leave today.

## 2013-06-21 NOTE — Telephone Encounter (Signed)
I reviewed med list.  CSD

## 2013-06-23 NOTE — Telephone Encounter (Signed)
Advised son, verbalized understanding  

## 2013-07-07 ENCOUNTER — Telehealth: Payer: Self-pay | Admitting: Cardiology

## 2013-07-07 NOTE — Telephone Encounter (Signed)
New Message:  Pt's son called requesting to make his mom's April recall.. Dr. Mare Ferrari is booked through June.Marland Kitchen He is requesting she be worked in.Marland KitchenMarland Kitchen

## 2013-07-07 NOTE — Telephone Encounter (Signed)
Spoke with son and patient just saw PCP and she is having a lot of back pain at current time. Discussed with  Dr. Mare Ferrari and ok to push appointment out until August. Advised son and will call him next week when schedule available

## 2013-07-14 NOTE — Telephone Encounter (Signed)
Scheduled August appointment with son

## 2013-07-30 ENCOUNTER — Other Ambulatory Visit: Payer: Self-pay | Admitting: *Deleted

## 2013-07-30 MED ORDER — ISOSORBIDE MONONITRATE ER 60 MG PO TB24: 60.0000 mg | ORAL_TABLET | Freq: Every day | ORAL | Status: DC

## 2013-08-26 ENCOUNTER — Other Ambulatory Visit: Payer: Self-pay | Admitting: *Deleted

## 2013-08-26 DIAGNOSIS — E039 Hypothyroidism, unspecified: Secondary | ICD-10-CM

## 2013-08-26 MED ORDER — DIGOXIN 125 MCG PO TABS: 125.0000 ug | ORAL_TABLET | Freq: Every day | ORAL | Status: DC

## 2013-08-26 MED ORDER — DILTIAZEM HCL ER COATED BEADS 300 MG PO CP24: 300.0000 mg | ORAL_CAPSULE | Freq: Every day | ORAL | Status: DC

## 2013-08-26 MED ORDER — LEVOTHYROXINE SODIUM 100 MCG PO TABS: 100.0000 ug | ORAL_TABLET | Freq: Every day | ORAL | Status: DC

## 2013-09-10 ENCOUNTER — Encounter: Payer: Self-pay | Admitting: Cardiology

## 2013-09-11 ENCOUNTER — Other Ambulatory Visit: Payer: Self-pay | Admitting: *Deleted

## 2013-09-11 MED ORDER — ISOSORBIDE MONONITRATE ER 60 MG PO TB24
60.0000 mg | ORAL_TABLET | Freq: Every day | ORAL | Status: AC
Start: 2013-09-11 — End: ?

## 2013-09-16 ENCOUNTER — Other Ambulatory Visit: Payer: Self-pay

## 2013-09-16 MED ORDER — NITROGLYCERIN 0.4 MG SL SUBL
0.4000 mg | SUBLINGUAL_TABLET | SUBLINGUAL | Status: AC | PRN
Start: 2013-09-16 — End: ?

## 2013-09-16 NOTE — Telephone Encounter (Signed)
nitroGLYCERIN (NITROSTAT) 0.4 MG SL tablet  Place 0.4 mg under the tongue every 5 (five) minutes as needed. For chest pains   Patient Instructions      Your physician recommends that you continue on your current medications as directed. Please refer to the Current Medication list given to you today.   Your physician wants you to follow-up in: 3 month ov/ekg/bmet You will receive a reminder letter in the mail two months in advance. If you don't receive a letter, please call our office to schedule the follow-up appointment.   Chart Reviewed By      Earvin Hansen  on 04/14/2013  5:43 PM

## 2013-10-01 ENCOUNTER — Encounter: Payer: Self-pay | Admitting: Cardiology

## 2013-10-03 ENCOUNTER — Telehealth: Payer: Self-pay | Admitting: *Deleted

## 2013-10-03 MED ORDER — FUROSEMIDE 40 MG PO TABS: 40.0000 mg | ORAL_TABLET | ORAL | Status: AC

## 2013-10-03 MED ORDER — DIGOXIN 125 MCG PO TABS: 125.0000 ug | ORAL_TABLET | Freq: Every day | ORAL | Status: AC

## 2013-10-03 MED ORDER — METOPROLOL SUCCINATE ER 100 MG PO TB24: ORAL_TABLET | ORAL | Status: AC

## 2013-10-03 MED ORDER — POTASSIUM CHLORIDE CRYS ER 20 MEQ PO TBCR: 20.0000 meq | EXTENDED_RELEASE_TABLET | ORAL | Status: AC

## 2013-10-03 MED ORDER — DILTIAZEM HCL ER COATED BEADS 300 MG PO CP24: 300.0000 mg | ORAL_CAPSULE | Freq: Every day | ORAL | Status: AC

## 2013-10-03 NOTE — Telephone Encounter (Signed)
It's me again. Can Mom get 90 days... Metoprolol Succinate 100mg  ter Furosemide 40mg  tab And any others.Carloyn Manner Sent to pharmacy as requested

## 2013-10-13 ENCOUNTER — Telehealth: Payer: Self-pay | Admitting: Cardiology

## 2013-10-13 NOTE — Telephone Encounter (Deleted)
Error

## 2013-10-17 ENCOUNTER — Encounter: Payer: Self-pay | Admitting: Cardiology

## 2013-10-17 ENCOUNTER — Ambulatory Visit (INDEPENDENT_AMBULATORY_CARE_PROVIDER_SITE_OTHER): Payer: Medicare Other | Admitting: Cardiology

## 2013-10-17 VITALS — BP 108/60 | HR 63 | Ht 62.0 in | Wt 94.6 lb

## 2013-10-17 DIAGNOSIS — S32010A Wedge compression fracture of first lumbar vertebra, initial encounter for closed fracture: Secondary | ICD-10-CM | POA: Insufficient documentation

## 2013-10-17 DIAGNOSIS — IMO0002 Reserved for concepts with insufficient information to code with codable children: Secondary | ICD-10-CM

## 2013-10-17 DIAGNOSIS — I4891 Unspecified atrial fibrillation: Secondary | ICD-10-CM

## 2013-10-17 DIAGNOSIS — S32010D Wedge compression fracture of first lumbar vertebra, subsequent encounter for fracture with routine healing: Secondary | ICD-10-CM

## 2013-10-17 DIAGNOSIS — I482 Chronic atrial fibrillation, unspecified: Secondary | ICD-10-CM

## 2013-10-17 DIAGNOSIS — I10 Essential (primary) hypertension: Secondary | ICD-10-CM

## 2013-10-17 DIAGNOSIS — R634 Abnormal weight loss: Secondary | ICD-10-CM

## 2013-10-17 NOTE — Progress Notes (Signed)
[name] Date of Birth:  [birth year] [facility] [address] [place], [name]  [address] [phone]        Fax   [phone]   History of Present Illness: This pleasant 80 year old woman is seen for a followup office visit. . She has a history of atrial fibrillation with controlled ventricular response. He also has a history of essential hypertension. She has had some previous unintentional weight loss. He does not have any history of ischemic heart disease and she had a normal adenosine Cardiolite stress test in 2008. She had an echocardiogram in 2009 showing an ejection fraction of 60-65% with mild aortic stenosis and moderate pulmonary hypertension and moderate mitral regurgitation. When we last saw her he was having problems with increased fatigue and exhaustion and was found to have iron deficiency anemia. She is being treated and followed for this by Dr. Arelia Sneddon and Dr. Paulita Fujita. She had a full column barium enema. The barium enema could not exclude a possible lesion in the right colon. Dr. Arelia Sneddon started her on ferrous sulfate 325 mg 1 daily and she feels much better. Her stools remained dark because of being on the iron therapy.  Since we last saw her she has had several emergency room visits for back pain.  X-rays on one visit showed a partial compression fracture of the first lumbar vertebra.   Current Outpatient Prescriptions  Medication Sig Dispense Refill  . chlordiazePOXIDE (LIBRIUM) 5 MG capsule Take 1 capsule (5 mg total) by mouth daily as needed. For nerves  30 capsule  5  . digoxin (LANOXIN) 0.125 MG tablet Take 1 tablet (125 mcg total) by mouth daily.  90 tablet  0  . diltiazem (CARDIZEM CD) 300 MG 24 hr capsule Take 1 capsule (300 mg total) by mouth daily.  90 capsule  3  . dorzolamide-timolol (COSOPT) 22.3-6.8 MG/ML ophthalmic solution Place 1 drop into both eyes 2 (two) times daily.       . ferrous sulfate 325 (65 FE) MG tablet Take 325 mg by  mouth daily with breakfast.      . furosemide (LASIX) 40 MG tablet Take 1 tablet (40 mg total) by mouth as directed.  90 tablet  3  . isosorbide mononitrate (IMDUR) 60 MG 24 hr tablet Take 1 tablet (60 mg total) by mouth daily.  90 tablet  1  . latanoprost (XALATAN) 0.005 % ophthalmic solution Place 1 drop into both eyes at bedtime.       Marland Kitchen levothyroxine (SYNTHROID, LEVOTHROID) 100 MCG tablet Take 1 tablet (100 mcg total) by mouth daily.  90 tablet  0  . LUTEIN PO Take 6-20 mg by mouth daily.      . metoprolol succinate (TOPROL-XL) 100 MG 24 hr tablet TAKE A  1/2 TABLET  TWICE A DAY, with or immediately following a meal.  90 tablet  3  . Multiple Vitamin (MULTIVITAMIN) tablet Take 1 tablet by mouth daily.      . nitroGLYCERIN (NITROSTAT) 0.4 MG SL tablet Place 1 tablet (0.4 mg total) under the tongue every 5 (five) minutes as needed. For chest pains  30 tablet  2  . potassium chloride SA (K-DUR,KLOR-CON) 20 MEQ tablet Take 1 tablet (20 mEq total) by mouth every other day.  90 tablet  3  . vitamin C (ASCORBIC ACID) 500 MG tablet Take 500 mg by mouth daily.      . vitamin E 200 UNIT capsule Take 200 Units by mouth daily.      Marland Kitchen  warfarin (COUMADIN) 5 MG tablet Take 2.5-5 mg by mouth every evening. Patient takes 2.5 mg every evening except on Monday patient takes 5 mg       No current facility-administered medications for this visit.    Allergies  Allergen Reactions  . Famotidine Other (See Comments)    Doesn't remember   . Penicillins     Doesn't remember   . Sulfa Antibiotics Other (See Comments)    Doesn't remember   . Tetracyclines & Related Other (See Comments)    Doesn't remember     Patient Active Problem List   Diagnosis Date Noted  . Hypertension     Priority: Medium  . Compression fracture of L1 lumbar vertebra 10/17/2013  . Iron deficiency anemia 10/03/2012  . Anemia 07/19/2012  . Malaise and fatigue 07/19/2012  . Hypothyroid 07/19/2012  . Community acquired pneumonia  12/30/2011  . Atrial fibrillation with RVR 12/30/2011  . Cough 12/30/2011  . Sore throat 12/30/2011  . Leukocytosis 12/30/2011  . Onychomycosis of toenail 10/26/2011  . Weight loss, non-intentional 01/25/2011  . Hypothyroidism     History  Smoking status  . Never Smoker   Smokeless tobacco  . Not on file    History  Alcohol Use No    No family history on file.  Review of Systems: Constitutional: no fever chills diaphoresis or fatigue or change in weight.  Head and neck: no hearing loss, no epistaxis, no photophobia or visual disturbance. Respiratory: No cough, shortness of breath or wheezing. Cardiovascular: No chest pain peripheral edema, palpitations. Gastrointestinal: No abdominal distention, no abdominal pain, no change in bowel habits hematochezia or melena. Genitourinary: No dysuria, no frequency, no urgency, no nocturia. Musculoskeletal:No arthralgias, no back pain, no gait disturbance or myalgias. Neurological: No dizziness, no headaches, no numbness, no seizures, no syncope, no weakness, no tremors. Hematologic: No lymphadenopathy, no easy bruising. Psychiatric: No confusion, no hallucinations, no sleep disturbance.    Physical Exam: Filed Vitals:   10/17/13 1521  BP: 108/60  Pulse: 63   the general appearance reveals a well-developed well-nourished elderly woman in no distress.The head and neck exam reveals pupils equal and reactive.  Extraocular movements are full.  There is no scleral icterus.  The mouth and pharynx are normal.  The neck is supple.  The carotids reveal no bruits.  The jugular venous pressure is normal.  The  thyroid is not enlarged.  There is no lymphadenopathy.  The chest is clear to percussion and auscultation.  There are no rales or rhonchi.  Expansion of the chest is symmetrical.  The precordium is quiet.  The pulse is irregularly irregular.  The first heart sound is normal.  The second heart sound is physiologically split.  There is a soft  apical systolic murmur. There is no abnormal lift or heave.  The abdomen is soft and nontender.  The bowel sounds are normal.  The liver and spleen are not enlarged.  There are no abdominal masses.  There are no abdominal bruits.  Extremities reveal good pedal pulses.  There is no phlebitis peripheral edema.  She does have lymphedema of the right arm related to prior breast surgery.  There is no cyanosis or clubbing.  Strength is normal and symmetrical in all extremities.  There is no lateralizing weakness.  There are no sensory deficits.  The skin is warm and dry.  There is no rash.   EKG today shows atrial fibrillation with left axis deviation and is unchanged since 01/01/13  Assessment / Plan: 1. chronic permanent atrial fibrillation on Coumadin 2. essential hypertension, controlled 3. nonintentional weight loss 4. Hypothyroidism 5. iron deficiency anemia 6. back pain secondary to compression fracture of L1  Plan: Continue same medication.  Recheck in 4 months for office visit.

## 2013-10-17 NOTE — Assessment & Plan Note (Signed)
She is on Coumadin.  Her prothrombin times are monitored through Dr. Arelia Sneddon office.  She has not had any TIA or stroke symptoms.  She has not had any hematochezia.

## 2013-10-17 NOTE — Assessment & Plan Note (Signed)
Her back pain is secondary to osteoporosis and partial compression fracture of L1.  The pain was severe at first but has gotten less intense with time.

## 2013-10-17 NOTE — Assessment & Plan Note (Signed)
Patient states that her appetite is good and that she enjoys eating.  I suspect that her weight loss has related to loss of muscle volume related to her age and inactivity.

## 2013-10-17 NOTE — Patient Instructions (Signed)
Your physician recommends that you continue on your current medications as directed. Please refer to the Current Medication list given to you today.  Your physician recommends that you schedule a follow-up appointment in: 4 month ov  

## 2013-10-17 NOTE — Assessment & Plan Note (Signed)
No symptoms referable to high blood pressure or heart failure.  No peripheral edema.  Weight is down 7 pounds since last visit

## 2013-10-30 NOTE — Telephone Encounter (Signed)
New message    Son calling   Need help with my chart.

## 2013-10-30 NOTE — Telephone Encounter (Signed)
I spoke with the pt's son and gave him a new activation code for My Chart.

## 2013-11-25 ENCOUNTER — Other Ambulatory Visit: Payer: Self-pay | Admitting: Cardiology

## 2013-11-27 ENCOUNTER — Other Ambulatory Visit: Payer: Self-pay | Admitting: Cardiology

## 2014-01-26 ENCOUNTER — Encounter: Payer: Self-pay | Admitting: *Deleted

## 2014-01-26 ENCOUNTER — Telehealth: Payer: Self-pay | Admitting: Cardiology

## 2014-01-26 NOTE — Telephone Encounter (Signed)
New message    Patient son calling to be restarted on mychart.

## 2014-01-26 NOTE — Telephone Encounter (Signed)
Letter sent to son and patient for re-activation of My-chart.

## 2014-02-17 ENCOUNTER — Encounter: Payer: Self-pay | Admitting: Cardiology

## 2014-02-17 ENCOUNTER — Ambulatory Visit (INDEPENDENT_AMBULATORY_CARE_PROVIDER_SITE_OTHER): Payer: Medicare Other | Admitting: Cardiology

## 2014-02-17 VITALS — BP 126/72 | HR 62 | Ht 62.0 in | Wt 94.0 lb

## 2014-02-17 DIAGNOSIS — I10 Essential (primary) hypertension: Secondary | ICD-10-CM

## 2014-02-17 DIAGNOSIS — W540XXA Bitten by dog, initial encounter: Secondary | ICD-10-CM

## 2014-02-17 DIAGNOSIS — R634 Abnormal weight loss: Secondary | ICD-10-CM

## 2014-02-17 DIAGNOSIS — I482 Chronic atrial fibrillation, unspecified: Secondary | ICD-10-CM

## 2014-02-17 DIAGNOSIS — S61451A Open bite of right hand, initial encounter: Secondary | ICD-10-CM

## 2014-02-17 DIAGNOSIS — S32010D Wedge compression fracture of first lumbar vertebra, subsequent encounter for fracture with routine healing: Secondary | ICD-10-CM

## 2014-02-17 DIAGNOSIS — I4891 Unspecified atrial fibrillation: Secondary | ICD-10-CM

## 2014-02-17 NOTE — Assessment & Plan Note (Signed)
Her appetite is good.  She does less cooking because it hurts her back to stand too long in the kitchen preparing meals.  She does have a known compression fracture of her spine.  She states that her appetite is good and today her weight is unchanged from the prior visit

## 2014-02-17 NOTE — Progress Notes (Signed)
[name] Date of Birth:  05/05/1914 [facility] [address] [place],   [address] [phone]        Fax   [phone]   History of Present Illness: This pleasant 80 year old woman is seen for a followup office visit. . She has a history of atrial fibrillation with controlled ventricular response. He also has a history of essential hypertension. She has had some previous unintentional weight loss. He does not have any history of ischemic heart disease and she had a normal adenosine Cardiolite stress test in 2008. She had an echocardiogram in 2009 showing an ejection fraction of 60-65% with mild aortic stenosis and moderate pulmonary hypertension and moderate mitral regurgitation. When we last saw her he was having problems with increased fatigue and exhaustion and was found to have iron deficiency anemia. She is being treated and followed for this by Dr. Arelia Sneddon and Dr. Paulita Fujita. She had a full column barium enema. The barium enema could not exclude a possible lesion in the right colon. Dr. Arelia Sneddon started her on ferrous sulfate 325 mg 1 daily and she feels much better. Her stools remained dark because of being on the iron therapy.  Since we last saw her she has had several emergency room visits for back pain.  X-rays on one visit showed a partial compression fracture of the first lumbar vertebra. The patient was bitten by her pet dog this morning on the fourth finger of the right hand.   Current Outpatient Prescriptions  Medication Sig Dispense Refill  . chlordiazePOXIDE (LIBRIUM) 5 MG capsule Take 1 capsule (5 mg total) by mouth daily as needed. For nerves 30 capsule 5  . DIGOX 125 MCG tablet TAKE 1 TABLET BY MOUTH ONCE DAILY 90 tablet 0  . digoxin (LANOXIN) 0.125 MG tablet Take 1 tablet (125 mcg total) by mouth daily. 90 tablet 0  . diltiazem (CARDIZEM CD) 300 MG 24 hr capsule Take 1 capsule (300 mg total) by mouth daily. 90 capsule 3  . dorzolamide-timolol  (COSOPT) 22.3-6.8 MG/ML ophthalmic solution Place 1 drop into both eyes 2 (two) times daily.     . ferrous sulfate 325 (65 FE) MG tablet Take 325 mg by mouth daily with breakfast.    . furosemide (LASIX) 40 MG tablet Take 1 tablet (40 mg total) by mouth as directed. 90 tablet 3  . isosorbide mononitrate (IMDUR) 60 MG 24 hr tablet Take 1 tablet (60 mg total) by mouth daily. 90 tablet 1  . latanoprost (XALATAN) 0.005 % ophthalmic solution Place 1 drop into both eyes at bedtime.     Marland Kitchen levothyroxine (SYNTHROID, LEVOTHROID) 100 MCG tablet TAKE 1 TABLET BY MOUTH ONCE DAILY 90 tablet 0  . LUTEIN PO Take 6-20 mg by mouth daily.    . metoprolol succinate (TOPROL-XL) 100 MG 24 hr tablet TAKE A  1/2 TABLET  TWICE A DAY, with or immediately following a meal. 90 tablet 3  . Multiple Vitamin (MULTIVITAMIN) tablet Take 1 tablet by mouth daily.    . nitroGLYCERIN (NITROSTAT) 0.4 MG SL tablet Place 1 tablet (0.4 mg total) under the tongue every 5 (five) minutes as needed. For chest pains 30 tablet 2  . potassium chloride SA (K-DUR,KLOR-CON) 20 MEQ tablet Take 1 tablet (20 mEq total) by mouth every other day. 90 tablet 3  . vitamin C (ASCORBIC ACID) 500 MG tablet Take 500 mg by mouth daily.    . vitamin E 200 UNIT capsule Take 200 Units by  mouth daily.    Marland Kitchen warfarin (COUMADIN) 5 MG tablet Take 2.5-5 mg by mouth every evening. Patient takes 2.5 mg every evening except on Monday patient takes 5 mg     No current facility-administered medications for this visit.    Allergies  Allergen Reactions  . Famotidine Other (See Comments)    Doesn't remember   . Penicillins     Doesn't remember   . Sulfa Antibiotics Other (See Comments)    Doesn't remember   . Tetracyclines & Related Other (See Comments)    Doesn't remember     Patient Active Problem List   Diagnosis Date Noted  . Hypertension     Priority: Medium  . Dog bite of right hand 02/17/2014  . Compression fracture of L1 lumbar vertebra 10/17/2013  .  Iron deficiency anemia 10/03/2012  . Anemia 07/19/2012  . Malaise and fatigue 07/19/2012  . Hypothyroid 07/19/2012  . Community acquired pneumonia 12/30/2011  . Atrial fibrillation with RVR 12/30/2011  . Cough 12/30/2011  . Sore throat 12/30/2011  . Leukocytosis 12/30/2011  . Onychomycosis of toenail 10/26/2011  . Weight loss, non-intentional 01/25/2011  . Hypothyroidism     History  Smoking status  . Never Smoker   Smokeless tobacco  . Not on file    History  Alcohol Use No    History reviewed. No pertinent family history.  Review of Systems: Constitutional: no fever chills diaphoresis or fatigue or change in weight.  Head and neck: no hearing loss, no epistaxis, no photophobia or visual disturbance. Respiratory: No cough, shortness of breath or wheezing. Cardiovascular: No chest pain peripheral edema, palpitations. Gastrointestinal: No abdominal distention, no abdominal pain, no change in bowel habits hematochezia or melena. Genitourinary: No dysuria, no frequency, no urgency, no nocturia. Musculoskeletal:No arthralgias, no back pain, no gait disturbance or myalgias. Neurological: No dizziness, no headaches, no numbness, no seizures, no syncope, no weakness, no tremors. Hematologic: No lymphadenopathy, no easy bruising. Psychiatric: No confusion, no hallucinations, no sleep disturbance.    Physical Exam: Filed Vitals:   02/17/14 1137  BP: 126/72  Pulse: 62   the general appearance reveals a well-developed well-nourished elderly woman in no distress.The head and neck exam reveals pupils equal and reactive.  Extraocular movements are full.  There is no scleral icterus.  The mouth and pharynx are normal.  The neck is supple.  The carotids reveal no bruits.  The jugular venous pressure is normal.  The  thyroid is not enlarged.  There is no lymphadenopathy.  The chest is clear to percussion and auscultation.  There are no rales or rhonchi.  Expansion of the chest is  symmetrical.  The precordium is quiet.  The pulse is irregularly irregular.  The first heart sound is normal.  The second heart sound is physiologically split.  There is a soft apical systolic murmur. There is no abnormal lift or heave.  The abdomen is soft and nontender.  The bowel sounds are normal.  The liver and spleen are not enlarged.  There are no abdominal masses.  There are no abdominal bruits.  Extremities reveal good pedal pulses.  There is no phlebitis peripheral edema.  She does have lymphedema of the right arm related to prior breast surgery.  There is no cyanosis or clubbing.  Strength is normal and symmetrical in all extremities.  There is no lateralizing weakness.  There are no sensory deficits.  The skin is warm and dry.  There is no rash.   EKG today  shows atrial fibrillation with left axis deviation and is unchanged since 10/17/13  Assessment / Plan: 1. chronic permanent atrial fibrillation on Coumadin 2. essential hypertension, controlled 3. nonintentional weight loss, stable since last visit 4. Hypothyroidism 5. iron deficiency anemia 6. back pain secondary to compression fracture of L1 7.  Dog bite to fourth finger right hand  Plan: Continue same medication.  Recheck in 4 months for office visit.  Refer her to her PCP Dr. Arelia Sneddon regarding the finger injury.

## 2014-02-17 NOTE — Patient Instructions (Signed)
Your physician recommends that you continue on your current medications as directed. Please refer to the Current Medication list given to you today.  Your physician wants you to follow-up in: 4 MONTH OV You will receive a reminder letter in the mail two months in advance. If you don't receive a letter, please call our office to schedule the follow-up appointment.  

## 2014-02-17 NOTE — Assessment & Plan Note (Signed)
The patient remains in chronic atrial fibrillation.  Her prothrombin times are followed by Dr. Arelia Sneddon.  She has not had any TIA or stroke symptoms.  No symptoms of heart failure.

## 2014-02-17 NOTE — Assessment & Plan Note (Signed)
The patient was bitten by her pet dog this morning by accident.  The dog was startled when the patient was climbing back into bed in the middle of the night and the dog was sleeping on the bottom of the bed.  She has a significant laceration and we will refer her to her PCP for further evaluation.  Her PCP is Dr. Arelia Sneddon.

## 2014-02-18 ENCOUNTER — Encounter: Payer: Self-pay | Admitting: Cardiology

## 2014-02-19 ENCOUNTER — Other Ambulatory Visit: Payer: Self-pay | Admitting: *Deleted

## 2014-02-19 ENCOUNTER — Encounter: Payer: Self-pay | Admitting: Cardiology

## 2014-02-19 MED ORDER — LEVOTHYROXINE SODIUM 100 MCG PO TABS: 100.0000 ug | ORAL_TABLET | Freq: Every day | ORAL | Status: AC

## 2014-02-28 ENCOUNTER — Inpatient Hospital Stay (HOSPITAL_COMMUNITY)
Admission: EM | Admit: 2014-02-28 | Discharge: 2014-03-04 | DRG: 481 | Disposition: A | Discharge status: Skilled Nursing Facility | Payer: Medicare Other | Attending: Internal Medicine | Admitting: Internal Medicine

## 2014-02-28 ENCOUNTER — Encounter: Payer: Self-pay | Admitting: Cardiology

## 2014-02-28 ENCOUNTER — Encounter (HOSPITAL_COMMUNITY): Payer: Self-pay | Admitting: Emergency Medicine

## 2014-02-28 ENCOUNTER — Emergency Department (HOSPITAL_COMMUNITY): Payer: Medicare Other

## 2014-02-28 DIAGNOSIS — I255 Ischemic cardiomyopathy: Secondary | ICD-10-CM | POA: Diagnosis present

## 2014-02-28 DIAGNOSIS — I10 Essential (primary) hypertension: Secondary | ICD-10-CM | POA: Diagnosis present

## 2014-02-28 DIAGNOSIS — Z853 Personal history of malignant neoplasm of breast: Secondary | ICD-10-CM

## 2014-02-28 DIAGNOSIS — E039 Hypothyroidism, unspecified: Secondary | ICD-10-CM | POA: Diagnosis present

## 2014-02-28 DIAGNOSIS — S61219A Laceration without foreign body of unspecified finger without damage to nail, initial encounter: Secondary | ICD-10-CM

## 2014-02-28 DIAGNOSIS — S72141A Displaced intertrochanteric fracture of right femur, initial encounter for closed fracture: Principal | ICD-10-CM | POA: Diagnosis present

## 2014-02-28 DIAGNOSIS — S61218A Laceration without foreign body of other finger without damage to nail, initial encounter: Secondary | ICD-10-CM | POA: Diagnosis present

## 2014-02-28 DIAGNOSIS — E876 Hypokalemia: Secondary | ICD-10-CM | POA: Diagnosis not present

## 2014-02-28 DIAGNOSIS — Z79899 Other long term (current) drug therapy: Secondary | ICD-10-CM

## 2014-02-28 DIAGNOSIS — I272 Other secondary pulmonary hypertension: Secondary | ICD-10-CM | POA: Diagnosis present

## 2014-02-28 DIAGNOSIS — M810 Age-related osteoporosis without current pathological fracture: Secondary | ICD-10-CM | POA: Diagnosis present

## 2014-02-28 DIAGNOSIS — Y92010 Kitchen of single-family (private) house as the place of occurrence of the external cause: Secondary | ICD-10-CM | POA: Diagnosis not present

## 2014-02-28 DIAGNOSIS — S72001A Fracture of unspecified part of neck of right femur, initial encounter for closed fracture: Secondary | ICD-10-CM

## 2014-02-28 DIAGNOSIS — M25551 Pain in right hip: Secondary | ICD-10-CM | POA: Diagnosis present

## 2014-02-28 DIAGNOSIS — Z9010 Acquired absence of unspecified breast and nipple: Secondary | ICD-10-CM | POA: Diagnosis present

## 2014-02-28 DIAGNOSIS — I080 Rheumatic disorders of both mitral and aortic valves: Secondary | ICD-10-CM | POA: Diagnosis present

## 2014-02-28 DIAGNOSIS — R54 Age-related physical debility: Secondary | ICD-10-CM | POA: Diagnosis present

## 2014-02-28 DIAGNOSIS — Z7901 Long term (current) use of anticoagulants: Secondary | ICD-10-CM

## 2014-02-28 DIAGNOSIS — I4891 Unspecified atrial fibrillation: Secondary | ICD-10-CM | POA: Diagnosis present

## 2014-02-28 DIAGNOSIS — I5032 Chronic diastolic (congestive) heart failure: Secondary | ICD-10-CM | POA: Diagnosis present

## 2014-02-28 DIAGNOSIS — S72009A Fracture of unspecified part of neck of unspecified femur, initial encounter for closed fracture: Secondary | ICD-10-CM | POA: Diagnosis present

## 2014-02-28 DIAGNOSIS — W19XXXA Unspecified fall, initial encounter: Secondary | ICD-10-CM

## 2014-02-28 DIAGNOSIS — I5030 Unspecified diastolic (congestive) heart failure: Secondary | ICD-10-CM

## 2014-02-28 LAB — BASIC METABOLIC PANEL
Anion gap: 14 (ref 5–15)
BUN: 14 mg/dL (ref 6–23)
CHLORIDE: 96 meq/L (ref 96–112)
CO2: 29 meq/L (ref 19–32)
CREATININE: 0.76 mg/dL (ref 0.50–1.10)
Calcium: 9.2 mg/dL (ref 8.4–10.5)
GFR calc Af Amer: 79 mL/min — ABNORMAL LOW (ref >90)
GFR calc non Af Amer: 68 mL/min — ABNORMAL LOW (ref >90)
Glucose, Bld: 119 mg/dL — ABNORMAL HIGH (ref 70–99)
POTASSIUM: 3.1 meq/L — AB (ref 3.7–5.3)
Sodium: 139 mEq/L (ref 137–147)

## 2014-02-28 LAB — CBC WITH DIFFERENTIAL/PLATELET
BASOS PCT: 0 % (ref 0–1)
Basophils Absolute: 0 10*3/uL (ref 0.0–0.1)
Eosinophils Absolute: 0.1 10*3/uL (ref 0.0–0.7)
Eosinophils Relative: 1 % (ref 0–5)
HEMATOCRIT: 39 % (ref 36.0–46.0)
HEMOGLOBIN: 12.8 g/dL (ref 12.0–15.0)
LYMPHS ABS: 0.9 10*3/uL (ref 0.7–4.0)
Lymphocytes Relative: 9 % — ABNORMAL LOW (ref 12–46)
MCH: 29.3 pg (ref 26.0–34.0)
MCHC: 32.8 g/dL (ref 30.0–36.0)
MCV: 89.2 fL (ref 78.0–100.0)
MONO ABS: 0.4 10*3/uL (ref 0.1–1.0)
MONOS PCT: 4 % (ref 3–12)
NEUTROS ABS: 8.6 10*3/uL — AB (ref 1.7–7.7)
Neutrophils Relative %: 86 % — ABNORMAL HIGH (ref 43–77)
Platelets: 317 10*3/uL (ref 150–400)
RBC: 4.37 MIL/uL (ref 3.87–5.11)
RDW: 14.9 % (ref 11.5–15.5)
WBC: 10 10*3/uL (ref 4.0–10.5)

## 2014-02-28 LAB — DIGOXIN LEVEL: DIGOXIN LVL: 1.4 ng/mL (ref 0.8–2.0)

## 2014-02-28 LAB — SURGICAL PCR SCREEN
MRSA, PCR: POSITIVE — AB
STAPHYLOCOCCUS AUREUS: POSITIVE — AB

## 2014-02-28 LAB — PROTIME-INR
INR: 2.44 — ABNORMAL HIGH (ref 0.00–1.49)
Prothrombin Time: 26.7 seconds — ABNORMAL HIGH (ref 11.6–15.2)

## 2014-02-28 MED ORDER — DORZOLAMIDE HCL-TIMOLOL MAL 2-0.5 % OP SOLN
1.0000 [drp] | Freq: Two times a day (BID) | OPHTHALMIC | Status: DC
  Administered 2014-02-28 – 2014-03-04 (×8): 1 [drp] via OPHTHALMIC
  Filled 2014-02-28: qty 10

## 2014-02-28 MED ORDER — ONDANSETRON HCL 4 MG/2ML IJ SOLN
4.0000 mg | Freq: Once | INTRAMUSCULAR | Status: AC
  Administered 2014-02-28: 4 mg via INTRAVENOUS
  Filled 2014-02-28: qty 2

## 2014-02-28 MED ORDER — SODIUM CHLORIDE 0.9 % IJ SOLN
3.0000 mL | Freq: Two times a day (BID) | INTRAMUSCULAR | Status: DC
  Administered 2014-02-28 – 2014-03-02 (×4): 3 mL via INTRAVENOUS

## 2014-02-28 MED ORDER — NITROGLYCERIN 0.4 MG SL SUBL: 0.4000 mg | SUBLINGUAL_TABLET | SUBLINGUAL | Status: DC | PRN

## 2014-02-28 MED ORDER — LEVOTHYROXINE SODIUM 100 MCG PO TABS
100.0000 ug | ORAL_TABLET | Freq: Every day | ORAL | Status: DC
  Administered 2014-03-01 – 2014-03-04 (×4): 100 ug via ORAL
  Filled 2014-02-28 (×5): qty 1

## 2014-02-28 MED ORDER — LIDOCAINE HCL (PF) 1 % IJ SOLN
10.0000 mL | Freq: Once | INTRAMUSCULAR | Status: AC
  Administered 2014-02-28: 10 mL
  Filled 2014-02-28: qty 10

## 2014-02-28 MED ORDER — DIGOXIN 125 MCG PO TABS
125.0000 ug | ORAL_TABLET | Freq: Every day | ORAL | Status: DC
  Administered 2014-02-28 – 2014-03-04 (×5): 125 ug via ORAL
  Filled 2014-02-28 (×5): qty 1

## 2014-02-28 MED ORDER — DILTIAZEM HCL ER COATED BEADS 300 MG PO CP24
300.0000 mg | ORAL_CAPSULE | Freq: Every day | ORAL | Status: DC
  Administered 2014-02-28 – 2014-03-04 (×5): 300 mg via ORAL
  Filled 2014-02-28 (×5): qty 1

## 2014-02-28 MED ORDER — POTASSIUM CHLORIDE CRYS ER 20 MEQ PO TBCR
40.0000 meq | EXTENDED_RELEASE_TABLET | Freq: Once | ORAL | Status: AC
  Administered 2014-02-28: 40 meq via ORAL
  Filled 2014-02-28: qty 2

## 2014-02-28 MED ORDER — MORPHINE SULFATE 2 MG/ML IJ SOLN
1.0000 mg | INTRAMUSCULAR | Status: DC | PRN
  Administered 2014-02-28 – 2014-03-01 (×4): 1 mg via INTRAVENOUS
  Filled 2014-02-28 (×4): qty 1

## 2014-02-28 MED ORDER — LATANOPROST 0.005 % OP SOLN
1.0000 [drp] | Freq: Every day | OPHTHALMIC | Status: DC
  Administered 2014-02-28 – 2014-03-03 (×4): 1 [drp] via OPHTHALMIC
  Filled 2014-02-28: qty 2.5

## 2014-02-28 MED ORDER — VANCOMYCIN HCL IN DEXTROSE 1-5 GM/200ML-% IV SOLN
1000.0000 mg | INTRAVENOUS | Status: AC
  Administered 2014-03-01: 1000 mg via INTRAVENOUS
  Filled 2014-02-28 (×2): qty 200

## 2014-02-28 MED ORDER — CHLORHEXIDINE GLUCONATE CLOTH 2 % EX PADS
6.0000 | MEDICATED_PAD | Freq: Every day | CUTANEOUS | Status: DC
  Administered 2014-03-01 – 2014-03-03 (×3): 6 via TOPICAL

## 2014-02-28 MED ORDER — MUPIROCIN 2 % EX OINT
TOPICAL_OINTMENT | Freq: Two times a day (BID) | CUTANEOUS | Status: DC
  Administered 2014-03-01 (×2): via NASAL
  Administered 2014-03-01: 1 via NASAL
  Administered 2014-03-02 – 2014-03-04 (×5): via NASAL
  Filled 2014-02-28: qty 22

## 2014-02-28 MED ORDER — MORPHINE SULFATE 4 MG/ML IJ SOLN
4.0000 mg | INTRAMUSCULAR | Status: DC | PRN
  Administered 2014-02-28: 4 mg via INTRAVENOUS
  Filled 2014-02-28: qty 1

## 2014-02-28 MED ORDER — CEFAZOLIN SODIUM-DEXTROSE 2-3 GM-% IV SOLR
2.0000 g | INTRAVENOUS | Status: AC
  Administered 2014-03-02: 2 g via INTRAVENOUS
  Filled 2014-02-28: qty 50

## 2014-02-28 NOTE — ED Notes (Signed)
Pt. Stated, i fell this morning 1st time ever.  I hurt on my rt. Hip, arm pain. Wound tear to right ring finger

## 2014-02-28 NOTE — H&P (Signed)
Date: 02/28/2014               Patient Name:  [name] MRN: [id number]  DOB: January 09, 1915 Age / Sex: 80 y.o., female   PCP: Leonard Downing, MD         Medical Service: Internal Medicine Teaching Service         Attending Physician: Dr. Gilles Chiquito, MD    First Contact: Dr. Randell Patient Pager: [phone]  Second Contact: Dr. Redmond Pulling Pager: [phone]       After Hours (After 5p/  First Contact Pager: [phone]  weekends / holidays): Second Contact Pager: [phone]   Chief Complaint: Fall  History of Present Illness: [name] is a 80 year old woman with history of atrial fibrillation on coumadin, hypothyroidism, HTN, diastolic CHF, valvular heart disease presenting after fall. She was standing in the kitchen at the time. She lost her balance and fell. Denies dizziness, lightheadedness, chest pain, palpitations, incontinence of bowel or bladder. No LOC or head trauma. She does not have a history of falls. She reports pain in her right shoulder and R hip. She has a laceration on her R fourth finger.   Denies anorexia, fevers, chills, vision changes, cough, shortness of breath, nausea/vomiting, constipation, diarrhea, hematochezia, melena, dysuria, hematuria, rash, hx of VTE, focal weakness. She has occasional mild intermittent generalized abdominal pain. Chronic occasional leg edema. Occasional bilateral feet paresthesias.   Meds: Current Facility-Administered Medications  Medication Dose Route Frequency Provider Last Rate Last Dose  . ceFAZolin (ANCEF) IVPB 2 g/50 mL premix  2 g Intravenous 30 min Pre-Op Johnny Bridge, MD      . morphine 4 MG/ML injection 4 mg  4 mg Intravenous Q1H PRN Tanna Furry, MD   4 mg at 02/28/14 1243  . [START ON 03/01/2014] vancomycin (VANCOCIN) IVPB 1000 mg/200 mL premix  1,000 mg Intravenous On Call to Shindler, MD       Current Outpatient Prescriptions  Medication Sig Dispense Refill  . digoxin (LANOXIN) 0.125 MG tablet Take 1 tablet (125 mcg  total) by mouth daily. 90 tablet 0  . diltiazem (CARDIZEM CD) 300 MG 24 hr capsule Take 1 capsule (300 mg total) by mouth daily. 90 capsule 3  . dorzolamide-timolol (COSOPT) 22.3-6.8 MG/ML ophthalmic solution Place 1 drop into both eyes 2 (two) times daily.     . ferrous sulfate 325 (65 FE) MG tablet Take 325 mg by mouth daily with breakfast.    . furosemide (LASIX) 40 MG tablet Take 1 tablet (40 mg total) by mouth as directed. 90 tablet 3  . isosorbide mononitrate (IMDUR) 60 MG 24 hr tablet Take 1 tablet (60 mg total) by mouth daily. 90 tablet 1  . latanoprost (XALATAN) 0.005 % ophthalmic solution Place 1 drop into both eyes at bedtime.     Marland Kitchen levothyroxine (SYNTHROID, LEVOTHROID) 100 MCG tablet Take 1 tablet (100 mcg total) by mouth daily. 90 tablet 1  . LUTEIN PO Take 1 tablet by mouth daily.     . metoprolol succinate (TOPROL-XL) 100 MG 24 hr tablet TAKE A  1/2 TABLET  TWICE A DAY, with or immediately following a meal. 90 tablet 3  . Multiple Vitamin (MULTIVITAMIN) tablet Take 1 tablet by mouth daily.    . nitroGLYCERIN (NITROSTAT) 0.4 MG SL tablet Place 1 tablet (0.4 mg total) under the tongue every 5 (five) minutes as needed. For chest pains 30 tablet 2  . potassium chloride SA (K-DUR,KLOR-CON) 20 MEQ tablet Take 1  tablet (20 mEq total) by mouth every other day. 90 tablet 3  . vitamin C (ASCORBIC ACID) 500 MG tablet Take 500 mg by mouth daily.    . vitamin E 200 UNIT capsule Take 200 Units by mouth daily.    Marland Kitchen warfarin (COUMADIN) 5 MG tablet Take 2.5 mg by mouth every evening.     . chlordiazePOXIDE (LIBRIUM) 5 MG capsule Take 1 capsule (5 mg total) by mouth daily as needed. For nerves (Patient not taking: Reported on 02/28/2014) 30 capsule 5  . DIGOX 125 MCG tablet TAKE 1 TABLET BY MOUTH ONCE DAILY (Patient not taking: Reported on 02/28/2014) 90 tablet 0    Allergies: Allergies as of 02/28/2014 - Review Complete 02/28/2014  Allergen Reaction Noted  . Famotidine Other (See Comments)  04/16/2010  . Penicillins  04/16/2010  . Sulfa antibiotics Other (See Comments) 04/16/2010  . Tetracyclines & related Other (See Comments) 04/16/2010   Past Medical History  Diagnosis Date  . Atrial fibrillation with controlled ventricular response   . Hypothyroidism   . Hypertension   . Diastolic CHF, acute on chronic   . Valvular heart disease     with severe mr,tr,and mod pulmonary regurgitation  . Breast cancer    Past Surgical History  Procedure Laterality Date  . Mastectomy    . Oopherectomy    . Appendectomy    . Cholecystectomy     History reviewed. No pertinent family history. History   Social History  . Marital Status: Widowed    Spouse Name: N/A    Number of Children: N/A  . Years of Education: N/A   Occupational History  . Not on file.   Social History Main Topics  . Smoking status: Never Smoker   . Smokeless tobacco: Not on file  . Alcohol Use: No  . Drug Use: Not on file  . Sexual Activity: No   Other Topics Concern  . Not on file   Social History Narrative    Review of Systems: Constitutional: no fevers/chills Eyes: no vision changes Ears, nose, mouth, throat, and face: no cough Respiratory: no shortness of breath Cardiovascular: no chest pain Gastrointestinal: no nausea/vomiting, no abdominal pain, no constipation, no diarrhea Genitourinary: no dysuria, no hematuria Integument: no rash Hematologic/lymphatic: no bleeding/bruising, no edema Musculoskeletal: no arthralgias, no myalgias Neurological: no paresthesias, no weakness  Physical Exam: Blood pressure 140/68, pulse 98, temperature 97.6 F (36.4 C), temperature source Oral, resp. rate 12, height 5\' 2"  (1.575 m), weight 97 lb (43.999 kg), SpO2 98 %. General Apperance: NAD Head: Normocephalic, atraumatic Eyes: PERRL, EOMI, anicteric sclera Ears: Normal external ear canal Nose: Nares normal, septum midline, mucosa normal Throat: Lips, mucosa and tongue normal  Neck: Supple, trachea  midline Back: No tenderness or bony abnormality  Lungs: Clear to auscultation bilaterally. No wheezes, rhonchi or rales. Breathing comfortably on RA Chest Wall: Nontender, no deformity Heart: Regular rate and rhythm, no murmur/rub/gallop Abdomen: Soft, nontender, nondistended, no rebound/guarding Extremities: Warm and well perfused, no edema, tenderness to palpation anterior R hip and R shoulder. No obvious deformities, erythema, or joint swelling. Pulses: 2+ throughout Skin: laceration repair on R fourth finger c/d/i without surrounding erythema or drainage Neurologic: Alert and oriented x 3. CNII-XII intact. Normal strength and sensation   Lab results: Basic Metabolic Panel:  Recent Labs  02/28/14 1220  NA 139  K 3.1*  CL 96  CO2 29  GLUCOSE 119*  BUN 14  CREATININE 0.76  CALCIUM 9.2   CBC:  Recent  Labs  02/28/14 1220  WBC 10.0  NEUTROABS 8.6*  HGB 12.8  HCT 39.0  MCV 89.2  PLT 317   Coagulation:  Recent Labs  02/28/14 1220  LABPROT 26.7*  INR 2.44*   Imaging results:  Dg Pelvis 1-2 Views  02/28/2014   CLINICAL DATA:  Pt fell in her kitchen this morning. Pain to right hip and right femur, laceration to right ring finger, pain to right shoulder.  EXAM: PELVIS - 1-2 VIEW  COMPARISON:  CT 05/29/2013  FINDINGS: Hips are located. No pelvic fracture sacral fracture. No femoral neck fracture.  IMPRESSION: No evidence of pelvic fracture or hip fracture.   Electronically Signed   By: Suzy Bouchard M.D.   On: 02/28/2014 14:18   Dg Shoulder Right  02/28/2014   CLINICAL DATA:  Pt fell in her kitchen this morning. Pain to right hip and right femur, laceration to right ring finger, pain to right shoulder.  EXAM: RIGHT SHOULDER - 2+ VIEW  COMPARISON:  None.  FINDINGS: There is no fracture or dislocation. There are mild degenerative changes of the acromioclavicular joint.  IMPRESSION: No acute osseous injury of the right shoulder.   Electronically Signed   By: Kathreen Devoid    On: 02/28/2014 14:16   Dg Femur Right  02/28/2014   CLINICAL DATA:  Pain after fall earlier today  EXAM: RIGHT FEMUR - 2 VIEW  COMPARISON:  None.  FINDINGS: Frontal and lateral views were obtained. There is a nondisplaced spiral fracture extending from the greater trochanter to the medial proximal femoral diaphysis. No other fracture. No dislocation. There is osteoarthritic change in the right hip and knee joints.  IMPRESSION: Nondisplaced spiral fracture extending from right greater trochanter to medial proximal femoral diaphysis. No dislocation. Areas of osteoarthritic change.   Electronically Signed   By: Lowella Grip M.D.   On: 02/28/2014 14:19   Ct Head Wo Contrast  02/28/2014   CLINICAL DATA:  Status post fall, right hip pain, right arm pain  EXAM: CT HEAD WITHOUT CONTRAST  CT CERVICAL SPINE WITHOUT CONTRAST  TECHNIQUE: Multidetector CT imaging of the head and cervical spine was performed following the standard protocol without intravenous contrast. Multiplanar CT image reconstructions of the cervical spine were also generated.  COMPARISON:  None.  FINDINGS: CT HEAD FINDINGS  There is no evidence of mass effect, midline shift, or extra-axial fluid collections. There is no evidence of a space-occupying lesion or intracranial hemorrhage. There is no evidence of a cortical-based area of acute infarction. There is generalized cerebral atrophy. There is periventricular white matter low attenuation likely secondary to microangiopathy.  The ventricles and sulci are appropriate for the patient's age. The basal cisterns are patent.  Visualized portions of the orbits are unremarkable. The visualized portions of the paranasal sinuses and mastoid air cells are unremarkable. Cerebrovascular atherosclerotic calcifications are noted.  The osseous structures are unremarkable.  CT CERVICAL SPINE FINDINGS  The alignment is anatomic. The vertebral body heights are maintained. There is no acute fracture. There is no  static listhesis. The prevertebral soft tissues are normal. The intraspinal soft tissues are not fully imaged on this examination due to poor soft tissue contrast, but there is no gross soft tissue abnormality.  There is diffuse degenerative disc disease throughout the cervical spine most severe at C4-5, C5-6 and C6-7. There are small broad-based disc osteophyte complexes at C3-4, C4-5, C5-6 and C6-7. There is bilateral facet arthropathy at C2-3. There is bilateral facet arthropathy and uncovertebral degenerative  changes at C3-4 with left foraminal encroachment. There is bilateral facet arthropathy and uncovertebral degenerative change at C4-5 with bilateral foraminal encroachment, right greater than left. There is right uncovertebral degenerative change and facet arthropathy resulting in right foraminal encroachment at C5-6. Bilateral facet arthropathy at C6-7 and C7-T1.  There is osteoarthritis of bilateral temporomandibular joints.  Bandlike area of airspace disease in the right lung apex adjacent to the azygos fissure with a linear hyperdensity within the soft tissue component. The overall appearance is unchanged compared with prior PET-CT dated 08/07/2008.  There is bilateral carotid artery atherosclerosis.  IMPRESSION: 1. No acute intracranial pathology. 2. No acute osseous injury of the cervical spine. 3. Cervical spine spondylosis as detailed above.   Electronically Signed   By: Kathreen Devoid   On: 02/28/2014 13:31   Ct Cervical Spine Wo Contrast  02/28/2014   CLINICAL DATA:  Status post fall, right hip pain, right arm pain  EXAM: CT HEAD WITHOUT CONTRAST  CT CERVICAL SPINE WITHOUT CONTRAST  TECHNIQUE: Multidetector CT imaging of the head and cervical spine was performed following the standard protocol without intravenous contrast. Multiplanar CT image reconstructions of the cervical spine were also generated.  COMPARISON:  None.  FINDINGS: CT HEAD FINDINGS  There is no evidence of mass effect, midline  shift, or extra-axial fluid collections. There is no evidence of a space-occupying lesion or intracranial hemorrhage. There is no evidence of a cortical-based area of acute infarction. There is generalized cerebral atrophy. There is periventricular white matter low attenuation likely secondary to microangiopathy.  The ventricles and sulci are appropriate for the patient's age. The basal cisterns are patent.  Visualized portions of the orbits are unremarkable. The visualized portions of the paranasal sinuses and mastoid air cells are unremarkable. Cerebrovascular atherosclerotic calcifications are noted.  The osseous structures are unremarkable.  CT CERVICAL SPINE FINDINGS  The alignment is anatomic. The vertebral body heights are maintained. There is no acute fracture. There is no static listhesis. The prevertebral soft tissues are normal. The intraspinal soft tissues are not fully imaged on this examination due to poor soft tissue contrast, but there is no gross soft tissue abnormality.  There is diffuse degenerative disc disease throughout the cervical spine most severe at C4-5, C5-6 and C6-7. There are small broad-based disc osteophyte complexes at C3-4, C4-5, C5-6 and C6-7. There is bilateral facet arthropathy at C2-3. There is bilateral facet arthropathy and uncovertebral degenerative changes at C3-4 with left foraminal encroachment. There is bilateral facet arthropathy and uncovertebral degenerative change at C4-5 with bilateral foraminal encroachment, right greater than left. There is right uncovertebral degenerative change and facet arthropathy resulting in right foraminal encroachment at C5-6. Bilateral facet arthropathy at C6-7 and C7-T1.  There is osteoarthritis of bilateral temporomandibular joints.  Bandlike area of airspace disease in the right lung apex adjacent to the azygos fissure with a linear hyperdensity within the soft tissue component. The overall appearance is unchanged compared with prior  PET-CT dated 08/07/2008.  There is bilateral carotid artery atherosclerosis.  IMPRESSION: 1. No acute intracranial pathology. 2. No acute osseous injury of the cervical spine. 3. Cervical spine spondylosis as detailed above.   Electronically Signed   By: Kathreen Devoid   On: 02/28/2014 13:31   Dg Humerus Right  02/28/2014   CLINICAL DATA:  Patient fell intention this morning. Pain to right hip and right femur. Laceration to ring finger.  EXAM: RIGHT HUMERUS - 2+ VIEW  COMPARISON:  None.  FINDINGS: There is no  fracture or dislocation on the right humerus.  IMPRESSION: No fracture or dislocation.   Electronically Signed   By: Suzy Bouchard M.D.   On: 02/28/2014 14:16   Dg Hand Complete Right  02/28/2014   CLINICAL DATA:  Fall, history of dog bite to the right fourth digit on 02/17/2014 by report.  EXAM: RIGHT HAND - COMPLETE 3+ VIEW  COMPARISON:  None.  FINDINGS: There is loss of the joint space at the proximal interphalangeal joint of the third digit. There bony erosion of the base of the middle phalanx of the third digit at the proximal interphalangeal joint. There is soft tissue swelling around venous proximal interphalangeal joint. There is mild radial subluxation of the middle phalanx.  There is soft tissue swelling at the proximal interphalangeal joint of the fourth digit. No evidence of fracture of fourth digit.  There is joint space loss of the distal interphalangeal joint of the second digit with sclerosis. Similar findings at the distal interphalangeal joint of the fifth digit. No evidence of acute fracture.  IMPRESSION: 1. No evidence of acute fracture. 2. Severe erosive arthropathy about the proximal interphalangeal joint of the third digit versus osteomyelitis. 3. Soft tissue swelling about the proximal interphalangeal joint of the fourth digit. 4. Osteoarthritis of the distant interphalangeal joints of the first and fifth digits. Findings conveyed Nanci Pina on 02/28/2014  at14:29.    Electronically Signed   By: Suzy Bouchard M.D.   On: 02/28/2014 14:29    Assessment & Plan by Problem: Active Problems:   Hip fracture  R intertrochanteric hip fracture: 2/2 mechanical fall. Nondisplaced spiral fracture extending from right greater trochanter to medial proximal femoral diaphysis seen on XR R femur. No dislocation. XR pelvis, R shoulder, R humerus, R hand with no acute fractures or dislocation. CT head, c spine, without acute abnormality. Ortho consulted. Plan for OR tomorrow. -NPO after MN -Hold Coumadin, will not need bridging as her CHADS2 score is 3. -IV morphine 1mg  Q3hr prn pain -PT/OT  Atrial Fibrillation: on Coumadin. CHADS2 score 3. Rate controlled. On Cardizem CD 300mg  daily, Toprol XL 50mg  BID at home -Coumadin as above -Continue home Cardizem CD 300mg  daily -Hold Toprol XL 50mg  BID  Ischemic cardiomyopathy, diastolic CHF: echocardiogram in 2009 showing an ejection fraction of 60-65% with mild aortic stenosis and moderate pulmonary hypertension and moderate mitral regurgitation. on digoxin 0.125mg  daily, lasix 40mg , imdur 60mg  daily, Toprol XL 50mg  BID at home -Hold lasix 40mg  -Continue digoxin 0.125mg  daily -Hold Imdur 60mg  daily -Hold Toprol XL 50mg  BID -Continue home nitrostat 0.4mg  Q64min prn chest pain  HTN: BP 119/50. On lasix 40mg , imdur 60mg  daily, Toprol XL 50mg  BID at home -Medications as above  Hypothyroidism: -continue home synthroid 121mcg daily  FEN -Heart healthy, NPO after MN -Hypokalemia to 3.1. Replete with 68mEq KCl PO x 1.  VTE ppx: SCDs  Dispo: Disposition is deferred at this time, awaiting improvement of current medical problems. Anticipated discharge in approximately 2-3 day(s).   The patient does have a current PCP Redmond Pulling Arna Medici, MD) and does not need an [facility] hospital follow-up appointment after discharge.  The patient does not have transportation limitations that hinder transportation to clinic  appointments.  Signed: Jacques Earthly, MD 02/28/2014, 4:29 PM

## 2014-02-28 NOTE — Consult Note (Signed)
ORTHOPAEDIC CONSULTATION  REQUESTING PHYSICIAN: Tanna Furry, MD  Chief Complaint: right hip pain  HPI: [name] is a 80 y.o. female who complains of  acute right hip pain after a mechanical fall today. She normally lives alone in a small house behind her son's house, and she just started using a cane 1 month ago. Pain is rated as moderate, worse with activity, better with rest. Denies any other injuries. She also did however lacerate her right hand, over the ring finger, which has been irrigated and repaired by the emergency room.  Past Medical History  Diagnosis Date  . Atrial fibrillation with controlled ventricular response   . Hypothyroidism   . Hypertension   . Diastolic CHF, acute on chronic   . Valvular heart disease     with severe mr,tr,and mod pulmonary regurgitation  . Breast cancer    Past Surgical History  Procedure Laterality Date  . Mastectomy    . Oopherectomy    . Appendectomy    . Cholecystectomy     History   Social History  . Marital Status: Widowed    Spouse Name: N/A    Number of Children: N/A  . Years of Education: N/A   Social History Main Topics  . Smoking status: Never Smoker   . Smokeless tobacco: None  . Alcohol Use: No  . Drug Use: None  . Sexual Activity: No   Other Topics Concern  . None   Social History Narrative   History reviewed. No pertinent family history.  her mother and father did not have osteoporosis that she knows of. She has had 4 brothers died of heart attacks however.  Allergies  Allergen Reactions  . Famotidine Other (See Comments)    Doesn't remember   . Penicillins     Doesn't remember   . Sulfa Antibiotics Other (See Comments)    Doesn't remember   . Tetracyclines & Related Other (See Comments)    Doesn't remember    Prior to Admission medications   Medication Sig Start Date End Date Taking? Authorizing Provider  digoxin (LANOXIN) 0.125 MG tablet Take 1 tablet (125 mcg total) by mouth daily. 10/03/13   Yes Darlin Coco, MD  diltiazem (CARDIZEM CD) 300 MG 24 hr capsule Take 1 capsule (300 mg total) by mouth daily. 10/03/13  Yes Darlin Coco, MD  dorzolamide-timolol (COSOPT) 22.3-6.8 MG/ML ophthalmic solution Place 1 drop into both eyes 2 (two) times daily.  03/18/13  Yes Historical Provider, MD  ferrous sulfate 325 (65 FE) MG tablet Take 325 mg by mouth daily with breakfast.   Yes Historical Provider, MD  furosemide (LASIX) 40 MG tablet Take 1 tablet (40 mg total) by mouth as directed. 10/03/13  Yes Darlin Coco, MD  isosorbide mononitrate (IMDUR) 60 MG 24 hr tablet Take 1 tablet (60 mg total) by mouth daily. 09/11/13  Yes Darlin Coco, MD  latanoprost (XALATAN) 0.005 % ophthalmic solution Place 1 drop into both eyes at bedtime.  10/10/11  Yes Historical Provider, MD  levothyroxine (SYNTHROID, LEVOTHROID) 100 MCG tablet Take 1 tablet (100 mcg total) by mouth daily. 02/19/14  Yes Darlin Coco, MD  LUTEIN PO Take 1 tablet by mouth daily.    Yes Historical Provider, MD  metoprolol succinate (TOPROL-XL) 100 MG 24 hr tablet TAKE A  1/2 TABLET  TWICE A DAY, with or immediately following a meal. 10/03/13  Yes Darlin Coco, MD  Multiple Vitamin (MULTIVITAMIN) tablet Take 1 tablet by mouth daily.   Yes Historical Provider, MD  nitroGLYCERIN (NITROSTAT) 0.4 MG SL tablet Place 1 tablet (0.4 mg total) under the tongue every 5 (five) minutes as needed. For chest pains 09/16/13  Yes Darlin Coco, MD  potassium chloride SA (K-DUR,KLOR-CON) 20 MEQ tablet Take 1 tablet (20 mEq total) by mouth every other day. 10/03/13  Yes Darlin Coco, MD  vitamin C (ASCORBIC ACID) 500 MG tablet Take 500 mg by mouth daily.   Yes Historical Provider, MD  vitamin E 200 UNIT capsule Take 200 Units by mouth daily.   Yes Historical Provider, MD  warfarin (COUMADIN) 5 MG tablet Take 2.5 mg by mouth every evening.  07/01/12  Yes Darlin Coco, MD  chlordiazePOXIDE (LIBRIUM) 5 MG capsule Take 1 capsule (5 mg total)  by mouth daily as needed. For nerves Patient not taking: Reported on 02/28/2014 06/04/13   Darlin Coco, MD  [place] 125 MCG tablet TAKE 1 TABLET BY MOUTH ONCE DAILY Patient not taking: Reported on 02/28/2014 11/28/13   Darlin Coco, MD   Dg Pelvis 1-2 Views  02/28/2014   CLINICAL DATA:  Pt fell in her kitchen this morning. Pain to right hip and right femur, laceration to right ring finger, pain to right shoulder.  EXAM: PELVIS - 1-2 VIEW  COMPARISON:  CT 05/29/2013  FINDINGS: Hips are located. No pelvic fracture sacral fracture. No femoral neck fracture.  IMPRESSION: No evidence of pelvic fracture or hip fracture.   Electronically Signed   By: Suzy Bouchard M.D.   On: 02/28/2014 14:18   Dg Shoulder Right  02/28/2014   CLINICAL DATA:  Pt fell in her kitchen this morning. Pain to right hip and right femur, laceration to right ring finger, pain to right shoulder.  EXAM: RIGHT SHOULDER - 2+ VIEW  COMPARISON:  None.  FINDINGS: There is no fracture or dislocation. There are mild degenerative changes of the acromioclavicular joint.  IMPRESSION: No acute osseous injury of the right shoulder.   Electronically Signed   By: Kathreen Devoid   On: 02/28/2014 14:16   Dg Femur Right  02/28/2014   CLINICAL DATA:  Pain after fall earlier today  EXAM: RIGHT FEMUR - 2 VIEW  COMPARISON:  None.  FINDINGS: Frontal and lateral views were obtained. There is a nondisplaced spiral fracture extending from the greater trochanter to the medial proximal femoral diaphysis. No other fracture. No dislocation. There is osteoarthritic change in the right hip and knee joints.  IMPRESSION: Nondisplaced spiral fracture extending from right greater trochanter to medial proximal femoral diaphysis. No dislocation. Areas of osteoarthritic change.   Electronically Signed   By: Lowella Grip M.D.   On: 02/28/2014 14:19   Ct Head Wo Contrast  02/28/2014   CLINICAL DATA:  Status post fall, right hip pain, right arm pain  EXAM: CT  HEAD WITHOUT CONTRAST  CT CERVICAL SPINE WITHOUT CONTRAST  TECHNIQUE: Multidetector CT imaging of the head and cervical spine was performed following the standard protocol without intravenous contrast. Multiplanar CT image reconstructions of the cervical spine were also generated.  COMPARISON:  None.  FINDINGS: CT HEAD FINDINGS  There is no evidence of mass effect, midline shift, or extra-axial fluid collections. There is no evidence of a space-occupying lesion or intracranial hemorrhage. There is no evidence of a cortical-based area of acute infarction. There is generalized cerebral atrophy. There is periventricular white matter low attenuation likely secondary to microangiopathy.  The ventricles and sulci are appropriate for the patient's age. The basal cisterns are patent.  Visualized portions of the orbits are  unremarkable. The visualized portions of the paranasal sinuses and mastoid air cells are unremarkable. Cerebrovascular atherosclerotic calcifications are noted.  The osseous structures are unremarkable.  CT CERVICAL SPINE FINDINGS  The alignment is anatomic. The vertebral body heights are maintained. There is no acute fracture. There is no static listhesis. The prevertebral soft tissues are normal. The intraspinal soft tissues are not fully imaged on this examination due to poor soft tissue contrast, but there is no gross soft tissue abnormality.  There is diffuse degenerative disc disease throughout the cervical spine most severe at C4-5, C5-6 and C6-7. There are small broad-based disc osteophyte complexes at C3-4, C4-5, C5-6 and C6-7. There is bilateral facet arthropathy at C2-3. There is bilateral facet arthropathy and uncovertebral degenerative changes at C3-4 with left foraminal encroachment. There is bilateral facet arthropathy and uncovertebral degenerative change at C4-5 with bilateral foraminal encroachment, right greater than left. There is right uncovertebral degenerative change and facet  arthropathy resulting in right foraminal encroachment at C5-6. Bilateral facet arthropathy at C6-7 and C7-T1.  There is osteoarthritis of bilateral temporomandibular joints.  Bandlike area of airspace disease in the right lung apex adjacent to the azygos fissure with a linear hyperdensity within the soft tissue component. The overall appearance is unchanged compared with prior PET-CT dated 08/07/2008.  There is bilateral carotid artery atherosclerosis.  IMPRESSION: 1. No acute intracranial pathology. 2. No acute osseous injury of the cervical spine. 3. Cervical spine spondylosis as detailed above.   Electronically Signed   By: Kathreen Devoid   On: 02/28/2014 13:31   Ct Cervical Spine Wo Contrast  02/28/2014   CLINICAL DATA:  Status post fall, right hip pain, right arm pain  EXAM: CT HEAD WITHOUT CONTRAST  CT CERVICAL SPINE WITHOUT CONTRAST  TECHNIQUE: Multidetector CT imaging of the head and cervical spine was performed following the standard protocol without intravenous contrast. Multiplanar CT image reconstructions of the cervical spine were also generated.  COMPARISON:  None.  FINDINGS: CT HEAD FINDINGS  There is no evidence of mass effect, midline shift, or extra-axial fluid collections. There is no evidence of a space-occupying lesion or intracranial hemorrhage. There is no evidence of a cortical-based area of acute infarction. There is generalized cerebral atrophy. There is periventricular white matter low attenuation likely secondary to microangiopathy.  The ventricles and sulci are appropriate for the patient's age. The basal cisterns are patent.  Visualized portions of the orbits are unremarkable. The visualized portions of the paranasal sinuses and mastoid air cells are unremarkable. Cerebrovascular atherosclerotic calcifications are noted.  The osseous structures are unremarkable.  CT CERVICAL SPINE FINDINGS  The alignment is anatomic. The vertebral body heights are maintained. There is no acute  fracture. There is no static listhesis. The prevertebral soft tissues are normal. The intraspinal soft tissues are not fully imaged on this examination due to poor soft tissue contrast, but there is no gross soft tissue abnormality.  There is diffuse degenerative disc disease throughout the cervical spine most severe at C4-5, C5-6 and C6-7. There are small broad-based disc osteophyte complexes at C3-4, C4-5, C5-6 and C6-7. There is bilateral facet arthropathy at C2-3. There is bilateral facet arthropathy and uncovertebral degenerative changes at C3-4 with left foraminal encroachment. There is bilateral facet arthropathy and uncovertebral degenerative change at C4-5 with bilateral foraminal encroachment, right greater than left. There is right uncovertebral degenerative change and facet arthropathy resulting in right foraminal encroachment at C5-6. Bilateral facet arthropathy at C6-7 and C7-T1.  There is osteoarthritis of  bilateral temporomandibular joints.  Bandlike area of airspace disease in the right lung apex adjacent to the azygos fissure with a linear hyperdensity within the soft tissue component. The overall appearance is unchanged compared with prior PET-CT dated 08/07/2008.  There is bilateral carotid artery atherosclerosis.  IMPRESSION: 1. No acute intracranial pathology. 2. No acute osseous injury of the cervical spine. 3. Cervical spine spondylosis as detailed above.   Electronically Signed   By: Kathreen Devoid   On: 02/28/2014 13:31   Dg Humerus Right  02/28/2014   CLINICAL DATA:  Patient fell intention this morning. Pain to right hip and right femur. Laceration to ring finger.  EXAM: RIGHT HUMERUS - 2+ VIEW  COMPARISON:  None.  FINDINGS: There is no fracture or dislocation on the right humerus.  IMPRESSION: No fracture or dislocation.   Electronically Signed   By: Suzy Bouchard M.D.   On: 02/28/2014 14:16   Dg Hand Complete Right  02/28/2014   CLINICAL DATA:  Fall, history of dog bite to the  right fourth digit on 02/17/2014 by report.  EXAM: RIGHT HAND - COMPLETE 3+ VIEW  COMPARISON:  None.  FINDINGS: There is loss of the joint space at the proximal interphalangeal joint of the third digit. There bony erosion of the base of the middle phalanx of the third digit at the proximal interphalangeal joint. There is soft tissue swelling around venous proximal interphalangeal joint. There is mild radial subluxation of the middle phalanx.  There is soft tissue swelling at the proximal interphalangeal joint of the fourth digit. No evidence of fracture of fourth digit.  There is joint space loss of the distal interphalangeal joint of the second digit with sclerosis. Similar findings at the distal interphalangeal joint of the fifth digit. No evidence of acute fracture.  IMPRESSION: 1. No evidence of acute fracture. 2. Severe erosive arthropathy about the proximal interphalangeal joint of the third digit versus osteomyelitis. 3. Soft tissue swelling about the proximal interphalangeal joint of the fourth digit. 4. Osteoarthritis of the distant interphalangeal joints of the first and fifth digits. Findings conveyed Nanci Pina on 02/28/2014  at14:29.   Electronically Signed   By: Suzy Bouchard M.D.   On: 02/28/2014 14:29    Positive ROS: All other systems have been reviewed and were otherwise negative with the exception of those mentioned in the HPI and as above.  Physical Exam: General: Alert, no acute distress, moderately frail, but very energetic. Cardiovascular: No pedal edema. She does have some edema in her right upper extremity, from previous axillary node dissections. Respiratory: No cyanosis, no use of accessory musculature GI: No organomegaly, abdomen is soft and non-tender Skin: No lesions in the area of chief complaint Neurologic: Sensation intact distally Psychiatric: Patient is competent for consent with normal mood and affect Lymphatic: No axillary or cervical  lymphadenopathy  MUSCULOSKELETAL: Right hip has positive log roll, she is able to do a straight leg raise however, but this causes fairly significant pain.  Assessment: Right intertrochanteric hip fracture, acute, minimally displaced. She has risk factors including her age, history of breast cancer, and cardiac condition, she is currently anticoagulated with an INR of 2.4. There is also an indication of previous MRSA PCR screen in the past..  Plan: This is an acute severe injury and threatened her ability to ambulate and her future independence. I discussed the options with the patient and her son, and they both clearly say they wish to have surgical intervention in order to restore ambulatory  function as soon as possible. I will plan to get an MRSA PCR screen preoperatively, and ideally her INR would be at least slightly less than 2.4. Renal plan to hold Coumadin, I would leave the option of vitamin K up to the primary team, but this may be a consideration. I'm in a plan for surgical intervention for tomorrow if she has been optimized. She will likely need mupirocin, vancomycin, and Ancef preoperatively given her history of MRSA colonization.   The risks benefits and alternatives were discussed with the patient including but not limited to the risks of nonoperative treatment, versus surgical intervention including infection, bleeding, nerve injury, malunion, nonunion, the need for revision surgery, hardware prominence, hardware failure, the need for hardware removal, blood clots, cardiopulmonary complications, morbidity, mortality, among others, and they were willing to proceed.       Johnny Bridge, MD Cell [phone]   02/28/2014 4:16 PM

## 2014-02-28 NOTE — ED Notes (Signed)
Ordered dinner tray, regular diet 

## 2014-02-28 NOTE — ED Provider Notes (Signed)
CSN: [id number]     Arrival date & time 02/28/14  1052 History   First MD Initiated Contact with Patient 02/28/14 1110     Chief Complaint  Patient presents with  . Fall  . Hip Pain  . Arm Pain      HPI  Patient presents for evaluation after a fall.  She lives in [place] functions well. Has family close by. This morning she was at her home. She states she turned and saw the last her balance and fell. She complains of pain in the right shoulder, and right hip area and she was able to ambulate with assistance from her sign, although complaining of right hip pain. Also has a laceration on her right fourth finger. This is same finger where she was bitten on the tip of her finger by her dog several days ago. No abnormality since that time no drainage no sign of infection or redness.  Denies any symptoms prior to her fall no lightheadedness or dizziness no palpitations is not orthostatic. No recent illness.  Past Medical History  Diagnosis Date  . Atrial fibrillation with controlled ventricular response   . Hypothyroidism   . Hypertension   . Diastolic CHF, acute on chronic   . Valvular heart disease     with severe mr,tr,and mod pulmonary regurgitation  . Breast cancer    Past Surgical History  Procedure Laterality Date  . Mastectomy    . Oopherectomy    . Appendectomy    . Cholecystectomy     History reviewed. No pertinent family history. History  Substance Use Topics  . Smoking status: Never Smoker   . Smokeless tobacco: Not on file  . Alcohol Use: No   OB History    No data available     Review of Systems  Constitutional: Negative for fever, chills, diaphoresis, appetite change and fatigue.  HENT: Negative for mouth sores, sore throat and trouble swallowing.   Eyes: Negative for visual disturbance.  Respiratory: Negative for cough, chest tightness, shortness of breath and wheezing.   Cardiovascular: Negative for chest pain.  Gastrointestinal: Negative for nausea,  vomiting, abdominal pain, diarrhea and abdominal distention.  Endocrine: Negative for polydipsia, polyphagia and polyuria.  Genitourinary: Negative for dysuria, frequency and hematuria.  Musculoskeletal: Negative for gait problem.       Right hip and right shoulder pain. Laceration to the dorsum of the right hand  Skin: Negative for color change, pallor and rash.  Neurological: Negative for dizziness, syncope, light-headedness and headaches.  Hematological: Does not bruise/bleed easily.  Psychiatric/Behavioral: Negative for behavioral problems and confusion.      Allergies  Famotidine; Penicillins; Sulfa antibiotics; and Tetracyclines & related  Home Medications   Prior to Admission medications   Medication Sig Start Date End Date Taking? Authorizing Provider  digoxin (LANOXIN) 0.125 MG tablet Take 1 tablet (125 mcg total) by mouth daily. 10/03/13  Yes Darlin Coco, MD  diltiazem (CARDIZEM CD) 300 MG 24 hr capsule Take 1 capsule (300 mg total) by mouth daily. 10/03/13  Yes Darlin Coco, MD  dorzolamide-timolol (COSOPT) 22.3-6.8 MG/ML ophthalmic solution Place 1 drop into both eyes 2 (two) times daily.  03/18/13  Yes Historical Provider, MD  ferrous sulfate 325 (65 FE) MG tablet Take 325 mg by mouth daily with breakfast.   Yes Historical Provider, MD  furosemide (LASIX) 40 MG tablet Take 1 tablet (40 mg total) by mouth as directed. 10/03/13  Yes Darlin Coco, MD  isosorbide mononitrate (IMDUR) 60 MG 24  hr tablet Take 1 tablet (60 mg total) by mouth daily. 09/11/13  Yes Darlin Coco, MD  latanoprost (XALATAN) 0.005 % ophthalmic solution Place 1 drop into both eyes at bedtime.  10/10/11  Yes Historical Provider, MD  levothyroxine (SYNTHROID, LEVOTHROID) 100 MCG tablet Take 1 tablet (100 mcg total) by mouth daily. 02/19/14  Yes Darlin Coco, MD  LUTEIN PO Take 1 tablet by mouth daily.    Yes Historical Provider, MD  metoprolol succinate (TOPROL-XL) 100 MG 24 hr tablet TAKE A  1/2  TABLET  TWICE A DAY, with or immediately following a meal. 10/03/13  Yes Darlin Coco, MD  Multiple Vitamin (MULTIVITAMIN) tablet Take 1 tablet by mouth daily.   Yes Historical Provider, MD  nitroGLYCERIN (NITROSTAT) 0.4 MG SL tablet Place 1 tablet (0.4 mg total) under the tongue every 5 (five) minutes as needed. For chest pains 09/16/13  Yes Darlin Coco, MD  potassium chloride SA (K-DUR,KLOR-CON) 20 MEQ tablet Take 1 tablet (20 mEq total) by mouth every other day. 10/03/13  Yes Darlin Coco, MD  vitamin C (ASCORBIC ACID) 500 MG tablet Take 500 mg by mouth daily.   Yes Historical Provider, MD  vitamin E 200 UNIT capsule Take 200 Units by mouth daily.   Yes Historical Provider, MD  warfarin (COUMADIN) 5 MG tablet Take 2.5 mg by mouth every evening.  07/01/12  Yes Darlin Coco, MD  chlordiazePOXIDE (LIBRIUM) 5 MG capsule Take 1 capsule (5 mg total) by mouth daily as needed. For nerves Patient not taking: Reported on 02/28/2014 06/04/13   Darlin Coco, MD  [place] 125 MCG tablet TAKE 1 TABLET BY MOUTH ONCE DAILY Patient not taking: Reported on 02/28/2014 11/28/13   Darlin Coco, MD   BP 140/68 mmHg  Pulse 98  Temp(Src) 97.6 F (36.4 C) (Oral)  Resp 12  Ht 5\' 2"  (1.575 m)  Wt 97 lb (43.999 kg)  BMI 17.74 kg/m2  SpO2 98% Physical Exam  Constitutional: She is oriented to person, place, and time. She appears well-developed and well-nourished. No distress.  HENT:  Head: Normocephalic.  Eyes: Conjunctivae are normal. Pupils are equal, round, and reactive to light. No scleral icterus.  Neck: Normal range of motion. Neck supple. No thyromegaly present.  Cardiovascular: Normal rate and regular rhythm.  Exam reveals no gallop and no friction rub.   No murmur heard. Pulmonary/Chest: Effort normal and breath sounds normal. No respiratory distress. She has no wheezes. She has no rales.  Abdominal: Soft. Bowel sounds are normal. She exhibits no distension. There is no tenderness. There  is no rebound.  Musculoskeletal: Normal range of motion.       Hands:      Legs: Neurological: She is alert and oriented to person, place, and time.  Skin: Skin is warm and dry. No rash noted.  Psychiatric: She has a normal mood and affect. Her behavior is normal.    ED Course  LACERATION REPAIR Date/Time: 02/28/2014 4:48 PM Performed by: Tanna Furry Authorized by: Tanna Furry Consent: Verbal consent obtained. Written consent obtained. Risks and benefits: risks, benefits and alternatives were discussed Consent given by: patient Patient understanding: patient states understanding of the procedure being performed Required items: required blood products, implants, devices, and special equipment available Patient identity confirmed: verbally with patient Laceration length: 2 cm Anesthesia: digital block Local anesthetic: lidocaine 1% without epinephrine Anesthetic total: 4 ml Irrigation solution: saline Skin closure: 4-0 nylon Number of sutures: 5 Technique: simple Approximation: close Approximation difficulty: simple Patient tolerance: Patient tolerated  the procedure well with no immediate complications Comments: Extensor tendon noted to be intact when taken through range of motion after anesthesia.   (including critical care time) Labs Review Labs Reviewed  BASIC METABOLIC PANEL - Abnormal; Notable for the following:    Potassium 3.1 (*)    Glucose, Bld 119 (*)    GFR calc non Af Amer 68 (*)    GFR calc Af Amer 79 (*)    All other components within normal limits  CBC WITH DIFFERENTIAL - Abnormal; Notable for the following:    Neutrophils Relative % 86 (*)    Neutro Abs 8.6 (*)    Lymphocytes Relative 9 (*)    All other components within normal limits  PROTIME-INR - Abnormal; Notable for the following:    Prothrombin Time 26.7 (*)    INR 2.44 (*)    All other components within normal limits  SURGICAL PCR SCREEN  DIGOXIN LEVEL    Imaging Review Dg Pelvis 1-2  Views  02/28/2014   CLINICAL DATA:  Pt fell in her kitchen this morning. Pain to right hip and right femur, laceration to right ring finger, pain to right shoulder.  EXAM: PELVIS - 1-2 VIEW  COMPARISON:  CT 05/29/2013  FINDINGS: Hips are located. No pelvic fracture sacral fracture. No femoral neck fracture.  IMPRESSION: No evidence of pelvic fracture or hip fracture.   Electronically Signed   By: Suzy Bouchard M.D.   On: 02/28/2014 14:18   Dg Shoulder Right  02/28/2014   CLINICAL DATA:  Pt fell in her kitchen this morning. Pain to right hip and right femur, laceration to right ring finger, pain to right shoulder.  EXAM: RIGHT SHOULDER - 2+ VIEW  COMPARISON:  None.  FINDINGS: There is no fracture or dislocation. There are mild degenerative changes of the acromioclavicular joint.  IMPRESSION: No acute osseous injury of the right shoulder.   Electronically Signed   By: Kathreen Devoid   On: 02/28/2014 14:16   Dg Femur Right  02/28/2014   CLINICAL DATA:  Pain after fall earlier today  EXAM: RIGHT FEMUR - 2 VIEW  COMPARISON:  None.  FINDINGS: Frontal and lateral views were obtained. There is a nondisplaced spiral fracture extending from the greater trochanter to the medial proximal femoral diaphysis. No other fracture. No dislocation. There is osteoarthritic change in the right hip and knee joints.  IMPRESSION: Nondisplaced spiral fracture extending from right greater trochanter to medial proximal femoral diaphysis. No dislocation. Areas of osteoarthritic change.   Electronically Signed   By: Lowella Grip M.D.   On: 02/28/2014 14:19   Ct Head Wo Contrast  02/28/2014   CLINICAL DATA:  Status post fall, right hip pain, right arm pain  EXAM: CT HEAD WITHOUT CONTRAST  CT CERVICAL SPINE WITHOUT CONTRAST  TECHNIQUE: Multidetector CT imaging of the head and cervical spine was performed following the standard protocol without intravenous contrast. Multiplanar CT image reconstructions of the cervical spine were  also generated.  COMPARISON:  None.  FINDINGS: CT HEAD FINDINGS  There is no evidence of mass effect, midline shift, or extra-axial fluid collections. There is no evidence of a space-occupying lesion or intracranial hemorrhage. There is no evidence of a cortical-based area of acute infarction. There is generalized cerebral atrophy. There is periventricular white matter low attenuation likely secondary to microangiopathy.  The ventricles and sulci are appropriate for the patient's age. The basal cisterns are patent.  Visualized portions of the orbits are unremarkable. The visualized portions  of the paranasal sinuses and mastoid air cells are unremarkable. Cerebrovascular atherosclerotic calcifications are noted.  The osseous structures are unremarkable.  CT CERVICAL SPINE FINDINGS  The alignment is anatomic. The vertebral body heights are maintained. There is no acute fracture. There is no static listhesis. The prevertebral soft tissues are normal. The intraspinal soft tissues are not fully imaged on this examination due to poor soft tissue contrast, but there is no gross soft tissue abnormality.  There is diffuse degenerative disc disease throughout the cervical spine most severe at C4-5, C5-6 and C6-7. There are small broad-based disc osteophyte complexes at C3-4, C4-5, C5-6 and C6-7. There is bilateral facet arthropathy at C2-3. There is bilateral facet arthropathy and uncovertebral degenerative changes at C3-4 with left foraminal encroachment. There is bilateral facet arthropathy and uncovertebral degenerative change at C4-5 with bilateral foraminal encroachment, right greater than left. There is right uncovertebral degenerative change and facet arthropathy resulting in right foraminal encroachment at C5-6. Bilateral facet arthropathy at C6-7 and C7-T1.  There is osteoarthritis of bilateral temporomandibular joints.  Bandlike area of airspace disease in the right lung apex adjacent to the azygos fissure with a  linear hyperdensity within the soft tissue component. The overall appearance is unchanged compared with prior PET-CT dated 08/07/2008.  There is bilateral carotid artery atherosclerosis.  IMPRESSION: 1. No acute intracranial pathology. 2. No acute osseous injury of the cervical spine. 3. Cervical spine spondylosis as detailed above.   Electronically Signed   By: Kathreen Devoid   On: 02/28/2014 13:31   Ct Cervical Spine Wo Contrast  02/28/2014   CLINICAL DATA:  Status post fall, right hip pain, right arm pain  EXAM: CT HEAD WITHOUT CONTRAST  CT CERVICAL SPINE WITHOUT CONTRAST  TECHNIQUE: Multidetector CT imaging of the head and cervical spine was performed following the standard protocol without intravenous contrast. Multiplanar CT image reconstructions of the cervical spine were also generated.  COMPARISON:  None.  FINDINGS: CT HEAD FINDINGS  There is no evidence of mass effect, midline shift, or extra-axial fluid collections. There is no evidence of a space-occupying lesion or intracranial hemorrhage. There is no evidence of a cortical-based area of acute infarction. There is generalized cerebral atrophy. There is periventricular white matter low attenuation likely secondary to microangiopathy.  The ventricles and sulci are appropriate for the patient's age. The basal cisterns are patent.  Visualized portions of the orbits are unremarkable. The visualized portions of the paranasal sinuses and mastoid air cells are unremarkable. Cerebrovascular atherosclerotic calcifications are noted.  The osseous structures are unremarkable.  CT CERVICAL SPINE FINDINGS  The alignment is anatomic. The vertebral body heights are maintained. There is no acute fracture. There is no static listhesis. The prevertebral soft tissues are normal. The intraspinal soft tissues are not fully imaged on this examination due to poor soft tissue contrast, but there is no gross soft tissue abnormality.  There is diffuse degenerative disc disease  throughout the cervical spine most severe at C4-5, C5-6 and C6-7. There are small broad-based disc osteophyte complexes at C3-4, C4-5, C5-6 and C6-7. There is bilateral facet arthropathy at C2-3. There is bilateral facet arthropathy and uncovertebral degenerative changes at C3-4 with left foraminal encroachment. There is bilateral facet arthropathy and uncovertebral degenerative change at C4-5 with bilateral foraminal encroachment, right greater than left. There is right uncovertebral degenerative change and facet arthropathy resulting in right foraminal encroachment at C5-6. Bilateral facet arthropathy at C6-7 and C7-T1.  There is osteoarthritis of bilateral temporomandibular joints.  Bandlike area of airspace disease in the right lung apex adjacent to the azygos fissure with a linear hyperdensity within the soft tissue component. The overall appearance is unchanged compared with prior PET-CT dated 08/07/2008.  There is bilateral carotid artery atherosclerosis.  IMPRESSION: 1. No acute intracranial pathology. 2. No acute osseous injury of the cervical spine. 3. Cervical spine spondylosis as detailed above.   Electronically Signed   By: Kathreen Devoid   On: 02/28/2014 13:31   Dg Humerus Right  02/28/2014   CLINICAL DATA:  Patient fell intention this morning. Pain to right hip and right femur. Laceration to ring finger.  EXAM: RIGHT HUMERUS - 2+ VIEW  COMPARISON:  None.  FINDINGS: There is no fracture or dislocation on the right humerus.  IMPRESSION: No fracture or dislocation.   Electronically Signed   By: Suzy Bouchard M.D.   On: 02/28/2014 14:16   Dg Hand Complete Right  02/28/2014   CLINICAL DATA:  Fall, history of dog bite to the right fourth digit on 02/17/2014 by report.  EXAM: RIGHT HAND - COMPLETE 3+ VIEW  COMPARISON:  None.  FINDINGS: There is loss of the joint space at the proximal interphalangeal joint of the third digit. There bony erosion of the base of the middle phalanx of the third digit at  the proximal interphalangeal joint. There is soft tissue swelling around venous proximal interphalangeal joint. There is mild radial subluxation of the middle phalanx.  There is soft tissue swelling at the proximal interphalangeal joint of the fourth digit. No evidence of fracture of fourth digit.  There is joint space loss of the distal interphalangeal joint of the second digit with sclerosis. Similar findings at the distal interphalangeal joint of the fifth digit. No evidence of acute fracture.  IMPRESSION: 1. No evidence of acute fracture. 2. Severe erosive arthropathy about the proximal interphalangeal joint of the third digit versus osteomyelitis. 3. Soft tissue swelling about the proximal interphalangeal joint of the fourth digit. 4. Osteoarthritis of the distant interphalangeal joints of the first and fifth digits. Findings conveyed Nanci Pina on 02/28/2014  at14:29.   Electronically Signed   By: Suzy Bouchard M.D.   On: 02/28/2014 14:29     EKG Interpretation None      MDM   Final diagnoses:  Fall  Hip fracture, right, closed, initial encounter  Finger laceration, initial encounter    Patient with nondisplaced right hip fracture. I discussed the case with Dr. Mardelle Matte who will see the patient emergency room. Also discussed with medicine teaching service. They will be admitting the patient. Both services are en route. Patient is anticoagulated. INR 2.44. Coumadin will be held. Will be kept nothing by mouth after midnight for planned procedure tomorrow.    Tanna Furry, MD 02/28/14 [phone]

## 2014-03-01 ENCOUNTER — Encounter (HOSPITAL_COMMUNITY): Payer: Self-pay | Admitting: Anesthesiology

## 2014-03-01 DIAGNOSIS — W19XXXA Unspecified fall, initial encounter: Secondary | ICD-10-CM

## 2014-03-01 DIAGNOSIS — S72144A Nondisplaced intertrochanteric fracture of right femur, initial encounter for closed fracture: Secondary | ICD-10-CM

## 2014-03-01 DIAGNOSIS — I5032 Chronic diastolic (congestive) heart failure: Secondary | ICD-10-CM

## 2014-03-01 DIAGNOSIS — Y939 Activity, unspecified: Secondary | ICD-10-CM

## 2014-03-01 DIAGNOSIS — I5030 Unspecified diastolic (congestive) heart failure: Secondary | ICD-10-CM

## 2014-03-01 DIAGNOSIS — R140 Abdominal distension (gaseous): Secondary | ICD-10-CM

## 2014-03-01 DIAGNOSIS — Z7901 Long term (current) use of anticoagulants: Secondary | ICD-10-CM

## 2014-03-01 LAB — PROTIME-INR
INR: 2.58 — ABNORMAL HIGH (ref 0.00–1.49)
Prothrombin Time: 27.9 seconds — ABNORMAL HIGH (ref 11.6–15.2)

## 2014-03-01 LAB — BASIC METABOLIC PANEL
Anion gap: 10 (ref 5–15)
BUN: 18 mg/dL (ref 6–23)
CALCIUM: 8.5 mg/dL (ref 8.4–10.5)
CO2: 30 meq/L (ref 19–32)
CREATININE: 0.9 mg/dL (ref 0.50–1.10)
Chloride: 101 mEq/L (ref 96–112)
GFR calc Af Amer: 60 mL/min — ABNORMAL LOW (ref >90)
GFR, EST NON AFRICAN AMERICAN: 52 mL/min — AB (ref >90)
GLUCOSE: 114 mg/dL — AB (ref 70–99)
Potassium: 4.2 mEq/L (ref 3.7–5.3)
Sodium: 141 mEq/L (ref 137–147)

## 2014-03-01 LAB — CBC
HEMATOCRIT: 36.7 % (ref 36.0–46.0)
Hemoglobin: 12 g/dL (ref 12.0–15.0)
MCH: 30.1 pg (ref 26.0–34.0)
MCHC: 32.7 g/dL (ref 30.0–36.0)
MCV: 92 fL (ref 78.0–100.0)
PLATELETS: 287 10*3/uL (ref 150–400)
RBC: 3.99 MIL/uL (ref 3.87–5.11)
RDW: 15.2 % (ref 11.5–15.5)
WBC: 8.1 10*3/uL (ref 4.0–10.5)

## 2014-03-01 MED ORDER — ACETAMINOPHEN 325 MG PO TABS: 650.0000 mg | ORAL_TABLET | Freq: Four times a day (QID) | ORAL | Status: DC | PRN

## 2014-03-01 MED ORDER — VITAMIN K1 10 MG/ML IJ SOLN
5.0000 mg | Freq: Once | INTRAMUSCULAR | Status: AC
  Administered 2014-03-01: 5 mg via INTRAVENOUS
  Filled 2014-03-01: qty 0.5

## 2014-03-01 MED ORDER — MORPHINE SULFATE 2 MG/ML IJ SOLN
1.0000 mg | INTRAMUSCULAR | Status: DC | PRN
  Administered 2014-03-01 – 2014-03-02 (×3): 1 mg via INTRAVENOUS
  Filled 2014-03-01 (×3): qty 1

## 2014-03-01 NOTE — Progress Notes (Signed)
Subjective: Doing well this morning. Pain is controlled. Denies new pain. Tolerating diet.  Objective: Vital signs in last 24 hours: Filed Vitals:   02/28/14 1900 02/28/14 1948 03/01/14 0437 03/01/14 1017  BP: 116/61 123/61 122/56 117/46  Pulse:  96 67 68  Temp:  97.9 F (36.6 C) 98 F (36.7 C)   TempSrc:  Oral Oral   Resp:  16 16   Height:  5\' 2"  (1.575 m)    Weight:  92 lb 9.6 oz (42.003 kg)    SpO2:  96% 98%    Weight change:   Intake/Output Summary (Last 24 hours) at 03/01/14 1217 Last data filed at 03/01/14 0900  Gross per 24 hour  Intake    240 ml  Output    500 ml  Net   -260 ml   General Apperance: NAD HEENT: Normocephalic, atraumatic, PERRL, EOMI, anicteric sclera Neck: Supple, trachea midline Lungs: Clear to auscultation bilaterally. No wheezes, rhonchi or rales. Breathing comfortably on RA Chest Wall: Nontender, no deformity Heart: Regular rate and rhythm, no murmur/rub/gallop Abdomen: Soft, nontender, nondistended, no rebound/guarding Extremities: Warm and well perfused, no edema, tenderness to palpation anterior R hip and R shoulder. No obvious deformities, erythema, or joint swelling. Pulses: 2+ throughout Skin: laceration repair on R fourth finger c/d/i without surrounding erythema or drainage Neurologic: Alert and oriented x 3. CNII-XII intact. Normal strength and sensation  Lab Results: Basic Metabolic Panel:  Recent Labs Lab 02/28/14 1220 03/01/14 0320  NA 139 141  K 3.1* 4.2  CL 96 101  CO2 29 30  GLUCOSE 119* 114*  BUN 14 18  CREATININE 0.76 0.90  CALCIUM 9.2 8.5   CBC:  Recent Labs Lab 02/28/14 1220 03/01/14 0320  WBC 10.0 8.1  NEUTROABS 8.6*  --   HGB 12.8 12.0  HCT 39.0 36.7  MCV 89.2 92.0  PLT 317 287   Coagulation:  Recent Labs Lab 02/28/14 1220 03/01/14 0320  LABPROT 26.7* 27.9*  INR 2.44* 2.58*    Micro Results: Recent Results (from the past 240 hour(s))  Surgical PCR screen     Status: Abnormal   Collection Time: 02/28/14  8:22 PM  Result Value Ref Range Status   MRSA, PCR POSITIVE (A) NEGATIVE Final    Comment: RESULT CALLED TO, READ BACK BY AND VERIFIED WITH:  TO RLIGHT(RN0 BY TCLEVELAND 02/28/2014 AT 2357    Staphylococcus aureus POSITIVE (A) NEGATIVE Final    Comment:        The Xpert SA Assay (FDA approved for NASAL specimens in patients over 56 years of age), is one component of a comprehensive surveillance program.  Test performance has been validated by [employer] for patients greater than or equal to 34 year old. It is not intended to diagnose infection nor to guide or monitor treatment. RESULT CALLED TO, READ BACK BY AND VERIFIED WITH:  TO RLIGHT(RN0 BY [facility] 02/28/2014 AT 2357    Studies/Results: Dg Pelvis 1-2 Views  02/28/2014   CLINICAL DATA:  Pt fell in her kitchen this morning. Pain to right hip and right femur, laceration to right ring finger, pain to right shoulder.  EXAM: PELVIS - 1-2 VIEW  COMPARISON:  CT 05/29/2013  FINDINGS: Hips are located. No pelvic fracture sacral fracture. No femoral neck fracture.  IMPRESSION: No evidence of pelvic fracture or hip fracture.   Electronically Signed   By: Suzy Bouchard M.D.   On: 02/28/2014 14:18   Dg Shoulder Right  02/28/2014   CLINICAL DATA:  Pt fell in her kitchen this morning. Pain to right hip and right femur, laceration to right ring finger, pain to right shoulder.  EXAM: RIGHT SHOULDER - 2+ VIEW  COMPARISON:  None.  FINDINGS: There is no fracture or dislocation. There are mild degenerative changes of the acromioclavicular joint.  IMPRESSION: No acute osseous injury of the right shoulder.   Electronically Signed   By: Kathreen Devoid   On: 02/28/2014 14:16   Dg Femur Right  02/28/2014   CLINICAL DATA:  Pain after fall earlier today  EXAM: RIGHT FEMUR - 2 VIEW  COMPARISON:  None.  FINDINGS: Frontal and lateral views were obtained. There is a nondisplaced spiral fracture extending from the greater  trochanter to the medial proximal femoral diaphysis. No other fracture. No dislocation. There is osteoarthritic change in the right hip and knee joints.  IMPRESSION: Nondisplaced spiral fracture extending from right greater trochanter to medial proximal femoral diaphysis. No dislocation. Areas of osteoarthritic change.   Electronically Signed   By: Lowella Grip M.D.   On: 02/28/2014 14:19   Ct Head Wo Contrast  02/28/2014   CLINICAL DATA:  Status post fall, right hip pain, right arm pain  EXAM: CT HEAD WITHOUT CONTRAST  CT CERVICAL SPINE WITHOUT CONTRAST  TECHNIQUE: Multidetector CT imaging of the head and cervical spine was performed following the standard protocol without intravenous contrast. Multiplanar CT image reconstructions of the cervical spine were also generated.  COMPARISON:  None.  FINDINGS: CT HEAD FINDINGS  There is no evidence of mass effect, midline shift, or extra-axial fluid collections. There is no evidence of a space-occupying lesion or intracranial hemorrhage. There is no evidence of a cortical-based area of acute infarction. There is generalized cerebral atrophy. There is periventricular white matter low attenuation likely secondary to microangiopathy.  The ventricles and sulci are appropriate for the patient's age. The basal cisterns are patent.  Visualized portions of the orbits are unremarkable. The visualized portions of the paranasal sinuses and mastoid air cells are unremarkable. Cerebrovascular atherosclerotic calcifications are noted.  The osseous structures are unremarkable.  CT CERVICAL SPINE FINDINGS  The alignment is anatomic. The vertebral body heights are maintained. There is no acute fracture. There is no static listhesis. The prevertebral soft tissues are normal. The intraspinal soft tissues are not fully imaged on this examination due to poor soft tissue contrast, but there is no gross soft tissue abnormality.  There is diffuse degenerative disc disease throughout the  cervical spine most severe at C4-5, C5-6 and C6-7. There are small broad-based disc osteophyte complexes at C3-4, C4-5, C5-6 and C6-7. There is bilateral facet arthropathy at C2-3. There is bilateral facet arthropathy and uncovertebral degenerative changes at C3-4 with left foraminal encroachment. There is bilateral facet arthropathy and uncovertebral degenerative change at C4-5 with bilateral foraminal encroachment, right greater than left. There is right uncovertebral degenerative change and facet arthropathy resulting in right foraminal encroachment at C5-6. Bilateral facet arthropathy at C6-7 and C7-T1.  There is osteoarthritis of bilateral temporomandibular joints.  Bandlike area of airspace disease in the right lung apex adjacent to the azygos fissure with a linear hyperdensity within the soft tissue component. The overall appearance is unchanged compared with prior PET-CT dated 08/07/2008.  There is bilateral carotid artery atherosclerosis.  IMPRESSION: 1. No acute intracranial pathology. 2. No acute osseous injury of the cervical spine. 3. Cervical spine spondylosis as detailed above.   Electronically Signed   By: Kathreen Devoid   On: 02/28/2014 13:31  Ct Cervical Spine Wo Contrast  02/28/2014   CLINICAL DATA:  Status post fall, right hip pain, right arm pain  EXAM: CT HEAD WITHOUT CONTRAST  CT CERVICAL SPINE WITHOUT CONTRAST  TECHNIQUE: Multidetector CT imaging of the head and cervical spine was performed following the standard protocol without intravenous contrast. Multiplanar CT image reconstructions of the cervical spine were also generated.  COMPARISON:  None.  FINDINGS: CT HEAD FINDINGS  There is no evidence of mass effect, midline shift, or extra-axial fluid collections. There is no evidence of a space-occupying lesion or intracranial hemorrhage. There is no evidence of a cortical-based area of acute infarction. There is generalized cerebral atrophy. There is periventricular white matter low  attenuation likely secondary to microangiopathy.  The ventricles and sulci are appropriate for the patient's age. The basal cisterns are patent.  Visualized portions of the orbits are unremarkable. The visualized portions of the paranasal sinuses and mastoid air cells are unremarkable. Cerebrovascular atherosclerotic calcifications are noted.  The osseous structures are unremarkable.  CT CERVICAL SPINE FINDINGS  The alignment is anatomic. The vertebral body heights are maintained. There is no acute fracture. There is no static listhesis. The prevertebral soft tissues are normal. The intraspinal soft tissues are not fully imaged on this examination due to poor soft tissue contrast, but there is no gross soft tissue abnormality.  There is diffuse degenerative disc disease throughout the cervical spine most severe at C4-5, C5-6 and C6-7. There are small broad-based disc osteophyte complexes at C3-4, C4-5, C5-6 and C6-7. There is bilateral facet arthropathy at C2-3. There is bilateral facet arthropathy and uncovertebral degenerative changes at C3-4 with left foraminal encroachment. There is bilateral facet arthropathy and uncovertebral degenerative change at C4-5 with bilateral foraminal encroachment, right greater than left. There is right uncovertebral degenerative change and facet arthropathy resulting in right foraminal encroachment at C5-6. Bilateral facet arthropathy at C6-7 and C7-T1.  There is osteoarthritis of bilateral temporomandibular joints.  Bandlike area of airspace disease in the right lung apex adjacent to the azygos fissure with a linear hyperdensity within the soft tissue component. The overall appearance is unchanged compared with prior PET-CT dated 08/07/2008.  There is bilateral carotid artery atherosclerosis.  IMPRESSION: 1. No acute intracranial pathology. 2. No acute osseous injury of the cervical spine. 3. Cervical spine spondylosis as detailed above.   Electronically Signed   By: Kathreen Devoid    On: 02/28/2014 13:31   Dg Humerus Right  02/28/2014   CLINICAL DATA:  Patient fell intention this morning. Pain to right hip and right femur. Laceration to ring finger.  EXAM: RIGHT HUMERUS - 2+ VIEW  COMPARISON:  None.  FINDINGS: There is no fracture or dislocation on the right humerus.  IMPRESSION: No fracture or dislocation.   Electronically Signed   By: Suzy Bouchard M.D.   On: 02/28/2014 14:16   Dg Hand Complete Right  02/28/2014   CLINICAL DATA:  Fall, history of dog bite to the right fourth digit on 02/17/2014 by report.  EXAM: RIGHT HAND - COMPLETE 3+ VIEW  COMPARISON:  None.  FINDINGS: There is loss of the joint space at the proximal interphalangeal joint of the third digit. There bony erosion of the base of the middle phalanx of the third digit at the proximal interphalangeal joint. There is soft tissue swelling around venous proximal interphalangeal joint. There is mild radial subluxation of the middle phalanx.  There is soft tissue swelling at the proximal interphalangeal joint of the fourth digit. No  evidence of fracture of fourth digit.  There is joint space loss of the distal interphalangeal joint of the second digit with sclerosis. Similar findings at the distal interphalangeal joint of the fifth digit. No evidence of acute fracture.  IMPRESSION: 1. No evidence of acute fracture. 2. Severe erosive arthropathy about the proximal interphalangeal joint of the third digit versus osteomyelitis. 3. Soft tissue swelling about the proximal interphalangeal joint of the fourth digit. 4. Osteoarthritis of the distant interphalangeal joints of the first and fifth digits. Findings conveyed Nanci Pina on 02/28/2014  at14:29.   Electronically Signed   By: Suzy Bouchard M.D.   On: 02/28/2014 14:29   Medications: I have reviewed the patient's current medications. Scheduled Meds: .  ceFAZolin (ANCEF) IV  2 g Intravenous 30 min Pre-Op  . Chlorhexidine Gluconate Cloth  6 each Topical Q0600  .  digoxin  125 mcg Oral Daily  . diltiazem  300 mg Oral Daily  . dorzolamide-timolol  1 drop Both Eyes BID  . latanoprost  1 drop Both Eyes QHS  . levothyroxine  100 mcg Oral QAC breakfast  . mupirocin ointment   Nasal BID  . phytonadione (VITAMIN K) IV  5 mg Intravenous Once  . sodium chloride  3 mL Intravenous Q12H   Continuous Infusions:  PRN Meds:.acetaminophen, morphine injection, nitroGLYCERIN Assessment/Plan: Principal Problem:   Hip fracture Active Problems:   Hypothyroidism   Hypertension   Atrial fibrillation   Diastolic CHF  R intertrochanteric hip fracture: 2/2 mechanical fall. Nondisplaced spiral fracture extending from right greater trochanter to medial proximal femoral diaphysis seen on XR R femur. No dislocation. Ortho consulted. INR increased today. Plan for OR tomorrow. -NPO after MN -Hold Coumadin, will not need bridging as her CHADS2 score is 3. -Vitamin K IV 5mg  x 1 -IV morphine 1mg  Q3hr prn pain, Tylenol 650mg  Q6hr prn pain -PT/OT  Atrial Fibrillation: on Coumadin. CHADS2 score 3. Rate controlled. On Cardizem CD 300mg  daily, Toprol XL 50mg  BID at home -Coumadin as above -Continue home Cardizem CD 300mg  daily -Hold Toprol XL 50mg  BID  Ischemic cardiomyopathy, diastolic CHF: echocardiogram in 2009 showing an ejection fraction of 60-65% with mild aortic stenosis and moderate pulmonary hypertension and moderate mitral regurgitation. on digoxin 0.125mg  daily, lasix 40mg , imdur 60mg  daily, Toprol XL 50mg  BID at home -Hold lasix 40mg  -Continue digoxin 0.125mg  daily -Hold Imdur 60mg  daily -Hold Toprol XL 50mg  BID -Continue home nitrostat 0.4mg  Q78min prn chest pain  HTN: BP 119/50. On lasix 40mg , imdur 60mg  daily, Toprol XL 50mg  BID at home -Medications as above  Hypothyroidism: -continue home synthroid 131mcg daily  FEN -Heart healthy, NPO after MN  VTE ppx: SCDs  Dispo: Disposition is deferred at this time, awaiting improvement of current medical  problems.  Anticipated discharge in approximately 2-3 day(s).   The patient does have a current PCP Redmond Pulling Arna Medici, MD) and does not need an [facility] hospital follow-up appointment after discharge.  The patient does not have transportation limitations that hinder transportation to clinic appointments.  .Services Needed at time of discharge: Y = Yes, Blank = No PT:   OT:   RN:   Equipment:   Other:     LOS: 1 day   Jacques Earthly, MD 03/01/2014, 12:17 PM

## 2014-03-01 NOTE — Progress Notes (Signed)
PT Cancellation Note  Patient Details Name: [name] MRN: [id number] DOB: 1915/02/13   Cancelled Treatment:    Reason Eval/Treat Not Completed: Patient not medically ready  Noted plan for surgery;  Will follow-up for PT eval postop with new orders and activity/Weight bearing status;   Thanks,  Roney Marion, PT  Acute Rehabilitation Services Pager [phone] Office [phone]    Roney Marion [facility] 03/01/2014, 7:10 AM

## 2014-03-01 NOTE — Progress Notes (Signed)
  Date: 03/01/2014  Patient name: [name]  Medical record number: [id number]  Date of birth: 1915-01-21   I have seen and evaluated [name] and discussed their care with the Residency Team.  [name] is a 80yo woman with h/o atrial fibrillation on coumadin, hypothyroidism, HTN, diastolic CHF, valvular HD who presents with a fall and broken hip. The fall was from standing and was due to losing balance, and maybe lightheadedness, per patient.  She also developed shoulder pain and laceration to right fourth finger which was repaired in the ED. She had xrays showing no fracture to shoulder, but a spiral intertrochanteric fracture on the right.   On exam, she is a thin, frail appearing woman, alert and oriented, CTAB, irreg irreg with no murmur note, abdomen is mildly distended this morning, non tender.  No LE edema.   Assessment and Plan: I have seen and evaluated the patient as outlined above. I agree with the formulated Assessment and Plan as detailed in the residents' admission note, with the following changes:   1. R Intertrochanteric hip fracture - Fragility fracture, diagnostic for osteoporosis.  - Orthopedics is following - INR not at goal today for surgery, will give vitamin K to reverse (Chads 2 score of 3 (Age, CHF, HTN) so no bridging) - Plan for OR tomorrow - Pain control with morphine - PT/OT after surgery  2. Atrial fibrillation - Hold coumadin - Vitamin K - Continue cardizem, holding metoprolol currently given slightly low blood pressure  3. ICM, diastolic CHF (last TTE in 2009) - Continue digoxin and nitroglycerin and cardizem - Holding metoprolol, Imdur, lasix - These can be restarted soon after surgery - It is not unreasonable to continue her beta blocker in the setting of ischemic heart disease and her chronic use of this medication, based on ACC guidelines (2014 ACC/AHA Guideline on Perioperative Cardiovascular Evaluation and Management of Patients Undergoing  Noncardiac Surgery).   - As her surgery is pending her INR reversal, if her HR were to become uncontrolled, would restart her beta blocker as it has already been held.   4. Abdominal distention - We will start with bladder scanning, consider abdominal xray as well.   For other medical issues, please see resident admission note.   Sid Falcon, MD 12/20/201511:37 AM

## 2014-03-01 NOTE — Progress Notes (Signed)
Pt started to become disoriented. Checked O2 sats, and it was 89. Placed 2L of O2 on patient, and reoriented patient. Next dose of pain medications...tylenol will be given instead of Morphine. Will continue to monitor patient.

## 2014-03-01 NOTE — Anesthesia Preprocedure Evaluation (Addendum)
Anesthesia Evaluation  Patient identified by MRN, date of birth, ID band Patient awake    Reviewed: Allergy & Precautions, H&P , NPO status , Patient's Chart, lab work & pertinent test results  Airway Mallampati: I  TM Distance: >3 FB Neck ROM: Full    Dental  (+) Edentulous Upper, Edentulous Lower, Dental Advisory Given   Pulmonary          Cardiovascular hypertension, Pt. on medications +CHF + dysrhythmias Atrial Fibrillation  ECHO 2009 EF 60%   Neuro/Psych    GI/Hepatic   Endo/Other  Hypothyroidism   Renal/GU      Musculoskeletal   Abdominal   Peds  Hematology  (+) anemia ,   Anesthesia Other Findings   Reproductive/Obstetrics                           Anesthesia Physical Anesthesia Plan  ASA: III  Anesthesia Plan: General   Post-op Pain Management:    Induction: Intravenous  Airway Management Planned: LMA and Oral ETT  Additional Equipment:   Intra-op Plan:   Post-operative Plan: Extubation in OR  Informed Consent: I have reviewed the patients History and Physical, chart, labs and discussed the procedure including the risks, benefits and alternatives for the proposed anesthesia with the patient or authorized representative who has indicated his/her understanding and acceptance.     Plan Discussed with: CRNA, Anesthesiologist and Surgeon  Anesthesia Plan Comments:         Anesthesia Quick Evaluation

## 2014-03-01 NOTE — Progress Notes (Signed)
Patient ID: [name], female   DOB: 1914-06-08, 80 y.o.   MRN: [id number]     Subjective:  Patient reports pain as mild to moderate.  Patient in bed and in no acute distress.  Denies any CP or SOB.   Objective:   VITALS:   Filed Vitals:   02/28/14 1830 02/28/14 1900 02/28/14 1948 03/01/14 0437  BP: 127/74 116/61 123/61 122/56  Pulse:   96 67  Temp:   97.9 F (36.6 C) 98 F (36.7 C)  TempSrc:   Oral Oral  Resp: 14  16 16   Height:   5\' 2"  (1.575 m)   Weight:   42.003 kg (92 lb 9.6 oz)   SpO2:   96% 98%    ABD soft Sensation intact distally Dorsiflexion/Plantar flexion intact EHL FHL firing  Lab Results  Component Value Date   WBC 8.1 03/01/2014   HGB 12.0 03/01/2014   HCT 36.7 03/01/2014   MCV 92.0 03/01/2014   PLT 287 03/01/2014   BMET    Component Value Date/Time   NA 141 03/01/2014 0320   K 4.2 03/01/2014 0320   CL 101 03/01/2014 0320   CO2 30 03/01/2014 0320   GLUCOSE 114* 03/01/2014 0320   BUN 18 03/01/2014 0320   CREATININE 0.90 03/01/2014 0320   CALCIUM 8.5 03/01/2014 0320   GFRNONAA 52* 03/01/2014 0320   GFRAA 60* 03/01/2014 0320     Assessment/Plan: Day of Surgery   Principal Problem:   Hip fracture Active Problems:   Hypothyroidism   Hypertension   Atrial fibrillation   Advance diet Plan for vit K NPO after midnight Plan for surgery tomorrow afternoon if INR better Continue pain regimen, minimize narcotics.   Remonia Richter 03/01/2014, 9:14 AM  Seen and agree with above.  Marchia Bond, MD Cell [phone]

## 2014-03-02 ENCOUNTER — Inpatient Hospital Stay (HOSPITAL_COMMUNITY): Payer: Medicare Other

## 2014-03-02 ENCOUNTER — Inpatient Hospital Stay (HOSPITAL_COMMUNITY): Payer: Medicare Other | Admitting: Anesthesiology

## 2014-03-02 ENCOUNTER — Encounter (HOSPITAL_COMMUNITY)
Admission: EM | Disposition: A | Discharge status: Skilled Nursing Facility | Payer: Self-pay | Source: Home / Self Care | Attending: Internal Medicine

## 2014-03-02 ENCOUNTER — Encounter (HOSPITAL_COMMUNITY): Payer: Self-pay | Admitting: Certified Registered"

## 2014-03-02 DIAGNOSIS — S72141A Displaced intertrochanteric fracture of right femur, initial encounter for closed fracture: Principal | ICD-10-CM

## 2014-03-02 DIAGNOSIS — I4891 Unspecified atrial fibrillation: Secondary | ICD-10-CM

## 2014-03-02 DIAGNOSIS — I5030 Unspecified diastolic (congestive) heart failure: Secondary | ICD-10-CM

## 2014-03-02 HISTORY — PX: INTRAMEDULLARY (IM) NAIL INTERTROCHANTERIC: SHX5875

## 2014-03-02 LAB — BASIC METABOLIC PANEL
ANION GAP: 15 (ref 5–15)
BUN: 13 mg/dL (ref 6–23)
CO2: 27 meq/L (ref 19–32)
CREATININE: 0.64 mg/dL (ref 0.50–1.10)
Calcium: 9.1 mg/dL (ref 8.4–10.5)
Chloride: 100 mEq/L (ref 96–112)
GFR calc Af Amer: 84 mL/min — ABNORMAL LOW (ref >90)
GFR calc non Af Amer: 72 mL/min — ABNORMAL LOW (ref >90)
Glucose, Bld: 151 mg/dL — ABNORMAL HIGH (ref 70–99)
POTASSIUM: 4 meq/L (ref 3.7–5.3)
SODIUM: 142 meq/L (ref 137–147)

## 2014-03-02 LAB — CBC
HEMATOCRIT: 41 % (ref 36.0–46.0)
HEMOGLOBIN: 13.7 g/dL (ref 12.0–15.0)
MCH: 30.2 pg (ref 26.0–34.0)
MCHC: 33.4 g/dL (ref 30.0–36.0)
MCV: 90.3 fL (ref 78.0–100.0)
Platelets: 282 10*3/uL (ref 150–400)
RBC: 4.54 MIL/uL (ref 3.87–5.11)
RDW: 15.4 % (ref 11.5–15.5)
WBC: 16.4 10*3/uL — AB (ref 4.0–10.5)

## 2014-03-02 LAB — PROTIME-INR
INR: 1.08 (ref 0.00–1.49)
Prothrombin Time: 14.1 seconds (ref 11.6–15.2)

## 2014-03-02 SURGERY — FIXATION, FRACTURE, INTERTROCHANTERIC, WITH INTRAMEDULLARY ROD
Anesthesia: General | Site: Hip | Laterality: Right

## 2014-03-02 SURGERY — FIXATION, FRACTURE, INTERTROCHANTERIC, WITH INTRAMEDULLARY ROD
Anesthesia: General

## 2014-03-02 MED ORDER — VANCOMYCIN HCL IN DEXTROSE 1-5 GM/200ML-% IV SOLN
1000.0000 mg | Freq: Once | INTRAVENOUS | Status: AC
  Administered 2014-03-02: 1000 mg via INTRAVENOUS
  Filled 2014-03-02: qty 200

## 2014-03-02 MED ORDER — SENNA-DOCUSATE SODIUM 8.6-50 MG PO TABS: 2.0000 | ORAL_TABLET | Freq: Every day | ORAL | Status: AC

## 2014-03-02 MED ORDER — ACETAMINOPHEN 650 MG RE SUPP: 650.0000 mg | Freq: Four times a day (QID) | RECTAL | Status: DC | PRN

## 2014-03-02 MED ORDER — ACETAMINOPHEN 325 MG PO TABS
650.0000 mg | ORAL_TABLET | Freq: Four times a day (QID) | ORAL | Status: DC | PRN
Start: 2014-03-02 — End: 2014-03-04

## 2014-03-02 MED ORDER — POLYETHYLENE GLYCOL 3350 17 G PO PACK: 17.0000 g | PACK | Freq: Every day | ORAL | Status: DC | PRN

## 2014-03-02 MED ORDER — METOCLOPRAMIDE HCL 10 MG PO TABS: 5.0000 mg | ORAL_TABLET | Freq: Three times a day (TID) | ORAL | Status: DC | PRN

## 2014-03-02 MED ORDER — WARFARIN SODIUM 2.5 MG PO TABS
2.5000 mg | ORAL_TABLET | Freq: Once | ORAL | Status: AC
  Administered 2014-03-02: 2.5 mg via ORAL
  Filled 2014-03-02: qty 1

## 2014-03-02 MED ORDER — VANCOMYCIN HCL IN DEXTROSE 1-5 GM/200ML-% IV SOLN
1000.0000 mg | Freq: Two times a day (BID) | INTRAVENOUS | Status: AC
  Administered 2014-03-02: 1000 mg via INTRAVENOUS
  Filled 2014-03-02: qty 200

## 2014-03-02 MED ORDER — SODIUM CHLORIDE 0.9 % IJ SOLN
INTRAMUSCULAR | Status: AC
  Filled 2014-03-02: qty 10

## 2014-03-02 MED ORDER — METOCLOPRAMIDE HCL 5 MG/ML IJ SOLN: 5.0000 mg | Freq: Three times a day (TID) | INTRAMUSCULAR | Status: DC | PRN

## 2014-03-02 MED ORDER — ONDANSETRON HCL 4 MG PO TABS: 4.0000 mg | ORAL_TABLET | Freq: Four times a day (QID) | ORAL | Status: DC | PRN

## 2014-03-02 MED ORDER — CEFAZOLIN SODIUM-DEXTROSE 2-3 GM-% IV SOLR
2.0000 g | Freq: Four times a day (QID) | INTRAVENOUS | Status: AC
  Administered 2014-03-02: 2 g via INTRAVENOUS
  Filled 2014-03-02 (×2): qty 50

## 2014-03-02 MED ORDER — 0.9 % SODIUM CHLORIDE (POUR BTL) OPTIME
TOPICAL | Status: DC | PRN
  Administered 2014-03-02: 1000 mL

## 2014-03-02 MED ORDER — MORPHINE SULFATE 2 MG/ML IJ SOLN
0.5000 mg | INTRAMUSCULAR | Status: DC | PRN
  Administered 2014-03-02: 0.5 mg via INTRAVENOUS
  Filled 2014-03-02: qty 1

## 2014-03-02 MED ORDER — ENOXAPARIN SODIUM 30 MG/0.3ML ~~LOC~~ SOLN
30.0000 mg | SUBCUTANEOUS | Status: DC
  Administered 2014-03-03 – 2014-03-04 (×2): 30 mg via SUBCUTANEOUS
  Filled 2014-03-02 (×3): qty 0.3

## 2014-03-02 MED ORDER — PROMETHAZINE HCL 25 MG/ML IJ SOLN: 6.2500 mg | INTRAMUSCULAR | Status: DC | PRN

## 2014-03-02 MED ORDER — PHENYLEPHRINE HCL 10 MG/ML IJ SOLN
INTRAMUSCULAR | Status: DC | PRN
  Administered 2014-03-02: 40 ug via INTRAVENOUS
  Administered 2014-03-02: 80 ug via INTRAVENOUS
  Administered 2014-03-02 (×2): 40 ug via INTRAVENOUS

## 2014-03-02 MED ORDER — POTASSIUM CHLORIDE IN NACL 20-0.45 MEQ/L-% IV SOLN
INTRAVENOUS | Status: DC
  Administered 2014-03-02 – 2014-03-03 (×2): via INTRAVENOUS
  Filled 2014-03-02 (×4): qty 1000

## 2014-03-02 MED ORDER — ARTIFICIAL TEARS OP OINT
TOPICAL_OINTMENT | OPHTHALMIC | Status: AC
  Filled 2014-03-02: qty 3.5

## 2014-03-02 MED ORDER — PROPOFOL 10 MG/ML IV BOLUS
INTRAVENOUS | Status: AC
  Filled 2014-03-02: qty 20

## 2014-03-02 MED ORDER — EPHEDRINE SULFATE 50 MG/ML IJ SOLN
INTRAMUSCULAR | Status: DC | PRN
  Administered 2014-03-02: 15 mg via INTRAVENOUS
  Administered 2014-03-02: 10 mg via INTRAVENOUS

## 2014-03-02 MED ORDER — FENTANYL CITRATE 0.05 MG/ML IJ SOLN: 25.0000 ug | INTRAMUSCULAR | Status: DC | PRN

## 2014-03-02 MED ORDER — LACTATED RINGERS IV SOLN
INTRAVENOUS | Status: DC
  Administered 2014-03-02: 13:00:00 via INTRAVENOUS

## 2014-03-02 MED ORDER — WARFARIN - PHARMACIST DOSING INPATIENT
Freq: Every day | Status: DC
  Administered 2014-03-02: 19:00:00

## 2014-03-02 MED ORDER — PROPOFOL 10 MG/ML IV BOLUS
INTRAVENOUS | Status: DC | PRN
  Administered 2014-03-02: 20 mg via INTRAVENOUS

## 2014-03-02 MED ORDER — ONDANSETRON HCL 4 MG/2ML IJ SOLN
INTRAMUSCULAR | Status: AC
  Filled 2014-03-02: qty 2

## 2014-03-02 MED ORDER — MEPERIDINE HCL 25 MG/ML IJ SOLN: 6.2500 mg | INTRAMUSCULAR | Status: DC | PRN

## 2014-03-02 MED ORDER — CEFAZOLIN SODIUM-DEXTROSE 2-3 GM-% IV SOLR
INTRAVENOUS | Status: DC | PRN
  Administered 2014-03-02: 2 g via INTRAVENOUS

## 2014-03-02 MED ORDER — METOPROLOL SUCCINATE ER 50 MG PO TB24
50.0000 mg | ORAL_TABLET | Freq: Two times a day (BID) | ORAL | Status: DC
  Administered 2014-03-02 – 2014-03-04 (×5): 50 mg via ORAL
  Filled 2014-03-02 (×7): qty 1

## 2014-03-02 MED ORDER — EPHEDRINE SULFATE 50 MG/ML IJ SOLN
INTRAMUSCULAR | Status: AC
  Filled 2014-03-02: qty 1

## 2014-03-02 MED ORDER — MENTHOL 3 MG MT LOZG: 1.0000 | LOZENGE | OROMUCOSAL | Status: DC | PRN

## 2014-03-02 MED ORDER — HYDROCODONE-ACETAMINOPHEN 5-325 MG PO TABS: 1.0000 | ORAL_TABLET | Freq: Four times a day (QID) | ORAL | Status: AC | PRN

## 2014-03-02 MED ORDER — SENNA 8.6 MG PO TABS
1.0000 | ORAL_TABLET | Freq: Two times a day (BID) | ORAL | Status: DC
  Administered 2014-03-02 – 2014-03-04 (×4): 8.6 mg via ORAL
  Filled 2014-03-02 (×5): qty 1

## 2014-03-02 MED ORDER — LIDOCAINE HCL (CARDIAC) 20 MG/ML IV SOLN
INTRAVENOUS | Status: AC
  Filled 2014-03-02: qty 5

## 2014-03-02 MED ORDER — DOCUSATE SODIUM 100 MG PO CAPS
100.0000 mg | ORAL_CAPSULE | Freq: Two times a day (BID) | ORAL | Status: DC
  Administered 2014-03-02 – 2014-03-04 (×4): 100 mg via ORAL
  Filled 2014-03-02 (×4): qty 1

## 2014-03-02 MED ORDER — BISACODYL 10 MG RE SUPP: 10.0000 mg | Freq: Every day | RECTAL | Status: DC | PRN

## 2014-03-02 MED ORDER — ROCURONIUM BROMIDE 50 MG/5ML IV SOLN
INTRAVENOUS | Status: AC
  Filled 2014-03-02: qty 1

## 2014-03-02 MED ORDER — WARFARIN SODIUM 5 MG PO TABS: 2.5000 mg | ORAL_TABLET | Freq: Every evening | ORAL | Status: DC

## 2014-03-02 MED ORDER — PROPOFOL INFUSION 10 MG/ML OPTIME
INTRAVENOUS | Status: DC | PRN
  Administered 2014-03-02: 50 ug/kg/min via INTRAVENOUS

## 2014-03-02 MED ORDER — MAGNESIUM CITRATE PO SOLN: 1.0000 | Freq: Once | ORAL | Status: AC | PRN

## 2014-03-02 MED ORDER — LACTATED RINGERS IV SOLN
INTRAVENOUS | Status: DC | PRN
  Administered 2014-03-02 (×2): via INTRAVENOUS

## 2014-03-02 MED ORDER — PHENOL 1.4 % MT LIQD: 1.0000 | OROMUCOSAL | Status: DC | PRN

## 2014-03-02 MED ORDER — HYDROCODONE-ACETAMINOPHEN 5-325 MG PO TABS
1.0000 | ORAL_TABLET | Freq: Four times a day (QID) | ORAL | Status: DC | PRN
  Administered 2014-03-02 – 2014-03-03 (×2): 1 via ORAL
  Filled 2014-03-02 (×2): qty 1

## 2014-03-02 MED ORDER — ONDANSETRON HCL 4 MG/2ML IJ SOLN: 4.0000 mg | Freq: Four times a day (QID) | INTRAMUSCULAR | Status: DC | PRN

## 2014-03-02 MED ORDER — WHITE PETROLATUM GEL
Status: AC
  Administered 2014-03-02: 0.2
  Filled 2014-03-02: qty 5

## 2014-03-02 MED ORDER — CEFAZOLIN SODIUM-DEXTROSE 2-3 GM-% IV SOLR
INTRAVENOUS | Status: AC
  Filled 2014-03-02: qty 50

## 2014-03-02 MED ORDER — ALUM & MAG HYDROXIDE-SIMETH 200-200-20 MG/5ML PO SUSP: 30.0000 mL | ORAL | Status: DC | PRN

## 2014-03-02 MED ORDER — FENTANYL CITRATE 0.05 MG/ML IJ SOLN
INTRAMUSCULAR | Status: AC
  Filled 2014-03-02: qty 5

## 2014-03-02 MED ORDER — ACETAMINOPHEN 325 MG PO TABS: 650.0000 mg | ORAL_TABLET | Freq: Four times a day (QID) | ORAL | Status: AC | PRN

## 2014-03-02 MED ORDER — SUCCINYLCHOLINE CHLORIDE 20 MG/ML IJ SOLN
INTRAMUSCULAR | Status: AC
  Filled 2014-03-02: qty 1

## 2014-03-02 SURGICAL SUPPLY — 39 items
BLADE HELICAL TFNA 90 HIP (Anchor) ×1 IMPLANT
BLADE HELICAL TFNA 90MM HIP (Anchor) ×1 IMPLANT
COVER PERINEAL POST (MISCELLANEOUS) ×3 IMPLANT
COVER SURGICAL LIGHT HANDLE (MISCELLANEOUS) ×3 IMPLANT
DRAPE STERI IOBAN 125X83 (DRAPES) ×3 IMPLANT
DRSG MEPILEX BORDER 4X4 (GAUZE/BANDAGES/DRESSINGS) ×3 IMPLANT
DRSG MEPILEX BORDER 4X8 (GAUZE/BANDAGES/DRESSINGS) ×4 IMPLANT
DURAPREP 26ML APPLICATOR (WOUND CARE) ×3 IMPLANT
ELECT REM PT RETURN 9FT ADLT (ELECTROSURGICAL) ×3
ELECTRODE REM PT RTRN 9FT ADLT (ELECTROSURGICAL) ×1 IMPLANT
EVACUATOR 1/8 PVC DRAIN (DRAIN) IMPLANT
FACESHIELD WRAPAROUND (MASK) ×6 IMPLANT
FACESHIELD WRAPAROUND OR TEAM (MASK) ×2 IMPLANT
GAUZE XEROFORM 5X9 LF (GAUZE/BANDAGES/DRESSINGS) ×3 IMPLANT
GLOVE BIOGEL PI IND STRL 7.0 (GLOVE) IMPLANT
GLOVE BIOGEL PI INDICATOR 7.0 (GLOVE) ×4
GLOVE BIOGEL PI ORTHO PRO SZ8 (GLOVE) ×4
GLOVE ORTHO TXT STRL SZ7.5 (GLOVE) ×6 IMPLANT
GLOVE PI ORTHO PRO STRL SZ8 (GLOVE) ×2 IMPLANT
GLOVE SURG ORTHO 8.0 STRL STRW (GLOVE) ×6 IMPLANT
GLOVE SURG SS PI 6.5 STRL IVOR (GLOVE) ×2 IMPLANT
GOWN STRL REUS W/ TWL LRG LVL3 (GOWN DISPOSABLE) IMPLANT
GOWN STRL REUS W/TWL LRG LVL3 (GOWN DISPOSABLE)
KIT ROOM TURNOVER OR (KITS) ×3 IMPLANT
NAIL TI CANN 11MM 130D HIP (Nail) ×2 IMPLANT
NS IRRIG 1000ML POUR BTL (IV SOLUTION) ×3 IMPLANT
PACK GENERAL/GYN (CUSTOM PROCEDURE TRAY) ×3 IMPLANT
PAD ARMBOARD 7.5X6 YLW CONV (MISCELLANEOUS) ×6 IMPLANT
REAMER ROD DEEP FLUTE 2.5X950 (INSTRUMENTS) ×2 IMPLANT
STAPLER VISISTAT 35W (STAPLE) ×3 IMPLANT
SUT VIC AB 0 CTB1 27 (SUTURE) ×3 IMPLANT
SUT VIC AB 2-0 FS1 27 (SUTURE) ×3 IMPLANT
SUT VIC AB 2-0 SH 27 (SUTURE)
SUT VIC AB 2-0 SH 27XBRD (SUTURE) IMPLANT
SUT VIC AB 3-0 SH 8-18 (SUTURE) ×3 IMPLANT
TOWEL OR 17X24 6PK STRL BLUE (TOWEL DISPOSABLE) ×3 IMPLANT
TOWEL OR 17X26 10 PK STRL BLUE (TOWEL DISPOSABLE) ×3 IMPLANT
TRAY FOLEY CATH 14FR (SET/KITS/TRAYS/PACK) ×2 IMPLANT
WATER STERILE IRR 1000ML POUR (IV SOLUTION) ×3 IMPLANT

## 2014-03-02 SURGICAL SUPPLY — 39 items
APL SKNCLS STERI-STRIP NONHPOA (GAUZE/BANDAGES/DRESSINGS) ×1
BENZOIN TINCTURE PRP APPL 2/3 (GAUZE/BANDAGES/DRESSINGS) ×4 IMPLANT
BOOTCOVER CLEANROOM LRG (PROTECTIVE WEAR) ×8 IMPLANT
CLOSURE STERI-STRIP 1/2X4 (GAUZE/BANDAGES/DRESSINGS) ×1
CLSR STERI-STRIP ANTIMIC 1/2X4 (GAUZE/BANDAGES/DRESSINGS) ×3 IMPLANT
COVER PERINEAL POST (MISCELLANEOUS) ×4 IMPLANT
COVER SURGICAL LIGHT HANDLE (MISCELLANEOUS) ×4 IMPLANT
DRAPE STERI IOBAN 125X83 (DRAPES) ×4 IMPLANT
DRSG MEPILEX BORDER 4X4 (GAUZE/BANDAGES/DRESSINGS) ×4 IMPLANT
DRSG MEPILEX BORDER 4X8 (GAUZE/BANDAGES/DRESSINGS) ×8 IMPLANT
DURAPREP 26ML APPLICATOR (WOUND CARE) ×4 IMPLANT
ELECT CAUTERY BLADE 6.4 (BLADE) ×4 IMPLANT
ELECT REM PT RETURN 9FT ADLT (ELECTROSURGICAL) ×3
ELECTRODE REM PT RTRN 9FT ADLT (ELECTROSURGICAL) ×2 IMPLANT
EVACUATOR 1/8 PVC DRAIN (DRAIN) IMPLANT
FACESHIELD WRAPAROUND (MASK) ×6 IMPLANT
FACESHIELD WRAPAROUND OR TEAM (MASK) ×2 IMPLANT
GAUZE XEROFORM 5X9 LF (GAUZE/BANDAGES/DRESSINGS) ×4 IMPLANT
GLOVE BIOGEL PI ORTHO PRO SZ8 (GLOVE) ×4
GLOVE ORTHO TXT STRL SZ7.5 (GLOVE) ×8 IMPLANT
GLOVE PI ORTHO PRO STRL SZ8 (GLOVE) ×4 IMPLANT
GLOVE SURG ORTHO 8.0 STRL STRW (GLOVE) ×8 IMPLANT
GOWN STRL REUS W/ TWL LRG LVL3 (GOWN DISPOSABLE) IMPLANT
GOWN STRL REUS W/TWL LRG LVL3 (GOWN DISPOSABLE)
KIT ROOM TURNOVER OR (KITS) ×4 IMPLANT
LINER BOOT UNIVERSAL DISP (MISCELLANEOUS) ×4 IMPLANT
MANIFOLD NEPTUNE II (INSTRUMENTS) ×4 IMPLANT
NS IRRIG 1000ML POUR BTL (IV SOLUTION) ×4 IMPLANT
PACK GENERAL/GYN (CUSTOM PROCEDURE TRAY) ×4 IMPLANT
PAD ARMBOARD 7.5X6 YLW CONV (MISCELLANEOUS) ×8 IMPLANT
STAPLER VISISTAT 35W (STAPLE) ×4 IMPLANT
SUT VIC AB 0 CTB1 27 (SUTURE) ×4 IMPLANT
SUT VIC AB 2-0 FS1 27 (SUTURE) ×4 IMPLANT
SUT VIC AB 2-0 SH 27 (SUTURE)
SUT VIC AB 2-0 SH 27XBRD (SUTURE) IMPLANT
SUT VIC AB 3-0 SH 8-18 (SUTURE) ×4 IMPLANT
TOWEL OR 17X24 6PK STRL BLUE (TOWEL DISPOSABLE) ×4 IMPLANT
TOWEL OR 17X26 10 PK STRL BLUE (TOWEL DISPOSABLE) ×4 IMPLANT
WATER STERILE IRR 1000ML POUR (IV SOLUTION) ×4 IMPLANT

## 2014-03-02 NOTE — Anesthesia Postprocedure Evaluation (Signed)
  Anesthesia Post-op Note  Patient: [name]  Procedure(s) Performed: Procedure(s): INTRAMEDULLARY (IM) NAIL INTERTROCHANTRIC (Right)  Patient Location: PACU  Anesthesia Type:Spinal  Level of Consciousness: awake and alert   Airway and Oxygen Therapy: Patient Spontanous Breathing  Post-op Pain: none  Post-op Assessment: Post-op Vital signs reviewed  Post-op Vital Signs: Reviewed  Last Vitals:  Filed Vitals:   03/02/14 1738  BP: 120/49  Pulse: 80  Temp: 36.2 C  Resp: 16    Complications: No apparent anesthesia complications

## 2014-03-02 NOTE — Progress Notes (Signed)
Subjective: Reports she is doing well this morning. Pain is controlled. Denies new pain. Tolerating diet. No CP or SOB.  Objective: Vital signs in last 24 hours: Filed Vitals:   03/01/14 1017 03/01/14 1433 03/01/14 2037 03/02/14 0604  BP: 117/46 127/41 128/51 150/71  Pulse: 68 52 89 119  Temp:  99.5 F (37.5 C) 99.3 F (37.4 C) 98.6 F (37 C)  TempSrc:  Oral Oral Oral  Resp:  18 16 18   Height:      Weight:      SpO2:  97% 93% 94%   Weight change:   Intake/Output Summary (Last 24 hours) at 03/02/14 0925 Last data filed at 03/01/14 1542  Gross per 24 hour  Intake    120 ml  Output    200 ml  Net    -80 ml   General Apperance: NAD HEENT: Normocephalic, atraumatic, PERRL, EOMI, anicteric sclera Neck: Supple, trachea midline Lungs: Clear to auscultation bilaterally. No wheezes, rhonchi or rales. Breathing comfortably on RA Chest Wall: Nontender, no deformity Heart: Regular rate and rhythm, no murmur/rub/gallop Abdomen: Soft, nontender, nondistended, no rebound/guarding Extremities: Warm and well perfused, no edema, tenderness to palpation anterior R hip and R shoulder. No obvious deformities, erythema, or joint swelling. Pulses: 2+ throughout Skin: laceration repair on R fourth finger c/d/i without surrounding erythema or drainage Neurologic: Alert and oriented x 3. CNII-XII intact. Normal strength and sensation  Lab Results: Basic Metabolic Panel:  Recent Labs Lab 03/01/14 0320 03/02/14 0811  NA 141 142  K 4.2 4.0  CL 101 100  CO2 30 27  GLUCOSE 114* 151*  BUN 18 13  CREATININE 0.90 0.64  CALCIUM 8.5 9.1   CBC:  Recent Labs Lab 02/28/14 1220 03/01/14 0320 03/02/14 0811  WBC 10.0 8.1 16.4*  NEUTROABS 8.6*  --   --   HGB 12.8 12.0 13.7  HCT 39.0 36.7 41.0  MCV 89.2 92.0 90.3  PLT 317 287 282   Coagulation:  Recent Labs Lab 02/28/14 1220 03/01/14 0320 03/02/14 0811  LABPROT 26.7* 27.9* 14.1  INR 2.44* 2.58* 1.08    Micro Results: Recent  Results (from the past 240 hour(s))  Surgical PCR screen     Status: Abnormal   Collection Time: 02/28/14  8:22 PM  Result Value Ref Range Status   MRSA, PCR POSITIVE (A) NEGATIVE Final    Comment: RESULT CALLED TO, READ BACK BY AND VERIFIED WITH:  TO RLIGHT(RN0 BY TCLEVELAND 02/28/2014 AT 2357    Staphylococcus aureus POSITIVE (A) NEGATIVE Final    Comment:        The Xpert SA Assay (FDA approved for NASAL specimens in patients over 51 years of age), is one component of a comprehensive surveillance program.  Test performance has been validated by [employer] for patients greater than or equal to 15 year old. It is not intended to diagnose infection nor to guide or monitor treatment. RESULT CALLED TO, READ BACK BY AND VERIFIED WITH:  TO RLIGHT(RN0 BY [facility] 02/28/2014 AT 2357    Studies/Results: Dg Pelvis 1-2 Views  02/28/2014   CLINICAL DATA:  Pt fell in her kitchen this morning. Pain to right hip and right femur, laceration to right ring finger, pain to right shoulder.  EXAM: PELVIS - 1-2 VIEW  COMPARISON:  CT 05/29/2013  FINDINGS: Hips are located. No pelvic fracture sacral fracture. No femoral neck fracture.  IMPRESSION: No evidence of pelvic fracture or hip fracture.   Electronically Signed   By: Nicole Kindred  Leonia Reeves M.D.   On: 02/28/2014 14:18   Dg Shoulder Right  02/28/2014   CLINICAL DATA:  Pt fell in her kitchen this morning. Pain to right hip and right femur, laceration to right ring finger, pain to right shoulder.  EXAM: RIGHT SHOULDER - 2+ VIEW  COMPARISON:  None.  FINDINGS: There is no fracture or dislocation. There are mild degenerative changes of the acromioclavicular joint.  IMPRESSION: No acute osseous injury of the right shoulder.   Electronically Signed   By: Kathreen Devoid   On: 02/28/2014 14:16   Dg Femur Right  02/28/2014   CLINICAL DATA:  Pain after fall earlier today  EXAM: RIGHT FEMUR - 2 VIEW  COMPARISON:  None.  FINDINGS: Frontal and lateral views were  obtained. There is a nondisplaced spiral fracture extending from the greater trochanter to the medial proximal femoral diaphysis. No other fracture. No dislocation. There is osteoarthritic change in the right hip and knee joints.  IMPRESSION: Nondisplaced spiral fracture extending from right greater trochanter to medial proximal femoral diaphysis. No dislocation. Areas of osteoarthritic change.   Electronically Signed   By: Lowella Grip M.D.   On: 02/28/2014 14:19   Ct Head Wo Contrast  02/28/2014   CLINICAL DATA:  Status post fall, right hip pain, right arm pain  EXAM: CT HEAD WITHOUT CONTRAST  CT CERVICAL SPINE WITHOUT CONTRAST  TECHNIQUE: Multidetector CT imaging of the head and cervical spine was performed following the standard protocol without intravenous contrast. Multiplanar CT image reconstructions of the cervical spine were also generated.  COMPARISON:  None.  FINDINGS: CT HEAD FINDINGS  There is no evidence of mass effect, midline shift, or extra-axial fluid collections. There is no evidence of a space-occupying lesion or intracranial hemorrhage. There is no evidence of a cortical-based area of acute infarction. There is generalized cerebral atrophy. There is periventricular white matter low attenuation likely secondary to microangiopathy.  The ventricles and sulci are appropriate for the patient's age. The basal cisterns are patent.  Visualized portions of the orbits are unremarkable. The visualized portions of the paranasal sinuses and mastoid air cells are unremarkable. Cerebrovascular atherosclerotic calcifications are noted.  The osseous structures are unremarkable.  CT CERVICAL SPINE FINDINGS  The alignment is anatomic. The vertebral body heights are maintained. There is no acute fracture. There is no static listhesis. The prevertebral soft tissues are normal. The intraspinal soft tissues are not fully imaged on this examination due to poor soft tissue contrast, but there is no gross soft  tissue abnormality.  There is diffuse degenerative disc disease throughout the cervical spine most severe at C4-5, C5-6 and C6-7. There are small broad-based disc osteophyte complexes at C3-4, C4-5, C5-6 and C6-7. There is bilateral facet arthropathy at C2-3. There is bilateral facet arthropathy and uncovertebral degenerative changes at C3-4 with left foraminal encroachment. There is bilateral facet arthropathy and uncovertebral degenerative change at C4-5 with bilateral foraminal encroachment, right greater than left. There is right uncovertebral degenerative change and facet arthropathy resulting in right foraminal encroachment at C5-6. Bilateral facet arthropathy at C6-7 and C7-T1.  There is osteoarthritis of bilateral temporomandibular joints.  Bandlike area of airspace disease in the right lung apex adjacent to the azygos fissure with a linear hyperdensity within the soft tissue component. The overall appearance is unchanged compared with prior PET-CT dated 08/07/2008.  There is bilateral carotid artery atherosclerosis.  IMPRESSION: 1. No acute intracranial pathology. 2. No acute osseous injury of the cervical spine. 3. Cervical spine spondylosis  as detailed above.   Electronically Signed   By: Kathreen Devoid   On: 02/28/2014 13:31   Ct Cervical Spine Wo Contrast  02/28/2014   CLINICAL DATA:  Status post fall, right hip pain, right arm pain  EXAM: CT HEAD WITHOUT CONTRAST  CT CERVICAL SPINE WITHOUT CONTRAST  TECHNIQUE: Multidetector CT imaging of the head and cervical spine was performed following the standard protocol without intravenous contrast. Multiplanar CT image reconstructions of the cervical spine were also generated.  COMPARISON:  None.  FINDINGS: CT HEAD FINDINGS  There is no evidence of mass effect, midline shift, or extra-axial fluid collections. There is no evidence of a space-occupying lesion or intracranial hemorrhage. There is no evidence of a cortical-based area of acute infarction. There is  generalized cerebral atrophy. There is periventricular white matter low attenuation likely secondary to microangiopathy.  The ventricles and sulci are appropriate for the patient's age. The basal cisterns are patent.  Visualized portions of the orbits are unremarkable. The visualized portions of the paranasal sinuses and mastoid air cells are unremarkable. Cerebrovascular atherosclerotic calcifications are noted.  The osseous structures are unremarkable.  CT CERVICAL SPINE FINDINGS  The alignment is anatomic. The vertebral body heights are maintained. There is no acute fracture. There is no static listhesis. The prevertebral soft tissues are normal. The intraspinal soft tissues are not fully imaged on this examination due to poor soft tissue contrast, but there is no gross soft tissue abnormality.  There is diffuse degenerative disc disease throughout the cervical spine most severe at C4-5, C5-6 and C6-7. There are small broad-based disc osteophyte complexes at C3-4, C4-5, C5-6 and C6-7. There is bilateral facet arthropathy at C2-3. There is bilateral facet arthropathy and uncovertebral degenerative changes at C3-4 with left foraminal encroachment. There is bilateral facet arthropathy and uncovertebral degenerative change at C4-5 with bilateral foraminal encroachment, right greater than left. There is right uncovertebral degenerative change and facet arthropathy resulting in right foraminal encroachment at C5-6. Bilateral facet arthropathy at C6-7 and C7-T1.  There is osteoarthritis of bilateral temporomandibular joints.  Bandlike area of airspace disease in the right lung apex adjacent to the azygos fissure with a linear hyperdensity within the soft tissue component. The overall appearance is unchanged compared with prior PET-CT dated 08/07/2008.  There is bilateral carotid artery atherosclerosis.  IMPRESSION: 1. No acute intracranial pathology. 2. No acute osseous injury of the cervical spine. 3. Cervical spine  spondylosis as detailed above.   Electronically Signed   By: Kathreen Devoid   On: 02/28/2014 13:31   Dg Humerus Right  02/28/2014   CLINICAL DATA:  Patient fell intention this morning. Pain to right hip and right femur. Laceration to ring finger.  EXAM: RIGHT HUMERUS - 2+ VIEW  COMPARISON:  None.  FINDINGS: There is no fracture or dislocation on the right humerus.  IMPRESSION: No fracture or dislocation.   Electronically Signed   By: Suzy Bouchard M.D.   On: 02/28/2014 14:16   Dg Hand Complete Right  02/28/2014   CLINICAL DATA:  Fall, history of dog bite to the right fourth digit on 02/17/2014 by report.  EXAM: RIGHT HAND - COMPLETE 3+ VIEW  COMPARISON:  None.  FINDINGS: There is loss of the joint space at the proximal interphalangeal joint of the third digit. There bony erosion of the base of the middle phalanx of the third digit at the proximal interphalangeal joint. There is soft tissue swelling around venous proximal interphalangeal joint. There is mild radial subluxation  of the middle phalanx.  There is soft tissue swelling at the proximal interphalangeal joint of the fourth digit. No evidence of fracture of fourth digit.  There is joint space loss of the distal interphalangeal joint of the second digit with sclerosis. Similar findings at the distal interphalangeal joint of the fifth digit. No evidence of acute fracture.  IMPRESSION: 1. No evidence of acute fracture. 2. Severe erosive arthropathy about the proximal interphalangeal joint of the third digit versus osteomyelitis. 3. Soft tissue swelling about the proximal interphalangeal joint of the fourth digit. 4. Osteoarthritis of the distant interphalangeal joints of the first and fifth digits. Findings conveyed Nanci Pina on 02/28/2014  at14:29.   Electronically Signed   By: Suzy Bouchard M.D.   On: 02/28/2014 14:29   Medications: I have reviewed the patient's current medications. Scheduled Meds: .  ceFAZolin (ANCEF) IV  2 g Intravenous 30  min Pre-Op  . Chlorhexidine Gluconate Cloth  6 each Topical Q0600  . digoxin  125 mcg Oral Daily  . diltiazem  300 mg Oral Daily  . dorzolamide-timolol  1 drop Both Eyes BID  . latanoprost  1 drop Both Eyes QHS  . levothyroxine  100 mcg Oral QAC breakfast  . metoprolol succinate  50 mg Oral BID  . mupirocin ointment   Nasal BID  . sodium chloride  3 mL Intravenous Q12H   Continuous Infusions:  PRN Meds:.acetaminophen, morphine injection, nitroGLYCERIN Assessment/Plan: Principal Problem:   Hip fracture Active Problems:   Hypothyroidism   Hypertension   Atrial fibrillation   Diastolic CHF  R intertrochanteric hip fracture: 2/2 mechanical fall. Nondisplaced spiral fracture extending from right greater trochanter to medial proximal femoral diaphysis seen on XR R femur. No dislocation. Ortho consulted. INR 1.08 today. OR today. -NPO, resume diet after procedure. -Hold Coumadin, will not need bridging as her CHADS2 score is 3. -IV morphine 1mg  Q3hr prn pain, Tylenol 650mg  Q6hr prn pain -PT/OT  Atrial Fibrillation: on Coumadin. CHADS2 score 3. On Cardizem CD 300mg  daily, Toprol XL 50mg  BID at home -Coumadin as above -Continue home Cardizem CD 300mg  daily -Resume Toprol XL 50mg  BID  Ischemic cardiomyopathy, diastolic CHF: echocardiogram in 2009 showing an ejection fraction of 60-65% with mild aortic stenosis and moderate pulmonary hypertension and moderate mitral regurgitation. on digoxin 0.125mg  daily, lasix 40mg , imdur 60mg  daily, Toprol XL 50mg  BID at home -Hold lasix 40mg  -Continue digoxin 0.125mg  daily -Hold Imdur 60mg  daily -Resume Toprol XL 50mg  BID -Continue home nitrostat 0.4mg  Q7min prn chest pain  HTN: BP 119/50. On lasix 40mg , imdur 60mg  daily, Toprol XL 50mg  BID at home -Medications as above  Hypothyroidism: -continue home synthroid 168mcg daily  FEN -NPO  VTE ppx: SCDs  Dispo: Disposition is deferred at this time, awaiting improvement of current medical  problems.  Anticipated discharge in approximately 2-3 day(s).   The patient does have a current PCP Redmond Pulling Arna Medici, MD) and does not need an [facility] hospital follow-up appointment after discharge.  The patient does not have transportation limitations that hinder transportation to clinic appointments.  .Services Needed at time of discharge: Y = Yes, Blank = No PT:   OT:   RN:   Equipment:   Other:     LOS: 2 days   Jacques Earthly, MD 03/02/2014, 9:25 AM

## 2014-03-02 NOTE — Anesthesia Procedure Notes (Addendum)
Procedure Name: MAC Date/Time: 03/02/2014 1:30 PM Performed by: Barrington Ellison Pre-anesthesia Checklist: Patient identified, Emergency Drugs available, Suction available, Patient being monitored and Timeout performed Patient Re-evaluated:Patient Re-evaluated prior to inductionOxygen Delivery Method: Nasal cannula   Spinal Patient location during procedure: OR Start time: 03/02/2014 1:20 PM End time: 03/02/2014 1:25 PM Staffing Performed by: anesthesiologist  Preanesthetic Checklist Completed: patient identified, site marked, surgical consent, pre-op evaluation, timeout performed, IV checked, risks and benefits discussed and monitors and equipment checked Spinal Block Patient position: right lateral decubitus Prep: ChloraPrep Patient monitoring: cardiac monitor, continuous pulse ox, blood pressure and heart rate Approach: right paramedian Location: L3-4 Injection technique: single-shot Needle Needle type: Tuohy  Needle gauge: 22 G Needle length: 9 cm Catheter at skin depth: 5 cm Assessment Sensory level: T10 Additional Notes 7.5 mg 0.75% marcaine injected easily

## 2014-03-02 NOTE — Progress Notes (Signed)
OT Cancellation Note  Patient Details Name: [name] MRN: [id number] DOB: 1914-08-14   Cancelled Treatment:    Reason Eval/Treat Not Completed: Medical issues which prohibited therapy. Pt has not had sx yet, plan is to have surgery today if INR within range. Will eval post surgery.  Almon Register [phone] 03/02/2014, 9:36 AM

## 2014-03-02 NOTE — Progress Notes (Signed)
PT Cancellation Note  Patient Details Name: [name] MRN: [id number] DOB: 09/08/1914   Cancelled Treatment:    Reason Eval/Treat Not Completed: Patient not medically ready. Pt scheduled for OR today if INR appropriate. PT to eval s/p surgery when appropriate.   Kingsley Callander 03/02/2014, 7:45 AM   Kittie Plater, PT, DPT Pager #: [phone] Office #: [phone]

## 2014-03-02 NOTE — Progress Notes (Signed)
The patient has been re-examined, and the chart reviewed, and there have been no interval changes to the documented history and physical.    The risks, benefits, and alternatives have been discussed at length, and the patient is willing to proceed.    INR now normalized.  Plan for IM nail.  Johnny Bridge, MD

## 2014-03-02 NOTE — Transfer of Care (Addendum)
Immediate Anesthesia Transfer of Care Note  Patient: [name]  Procedure(s) Performed: Procedure(s): INTRAMEDULLARY (IM) NAIL INTERTROCHANTRIC (Right)  Patient Location: PACU  Anesthesia Type:Spinal  Level of Consciousness: awake, alert  and oriented  Airway & Oxygen Therapy: Patient Spontanous Breathing and Patient connected to nasal cannula oxygen  Post-op Assessment: Report given to PACU RN  Post vital signs: Reviewed and stable  Complications: No apparent anesthesia complications

## 2014-03-02 NOTE — Op Note (Signed)
DATE OF SURGERY:  03/02/2014  TIME: 2:57 PM  PATIENT NAME:  [name]  AGE: 80 y.o.  PRE-OPERATIVE DIAGNOSIS:  RIGHT HIP FRACTURE  POST-OPERATIVE DIAGNOSIS:  SAME  PROCEDURE:  INTRAMEDULLARY (IM) NAIL INTERTROCHANTRIC  SURGEON:  Valentina Alcoser P  ASSISTANT:  Joya Gaskins, OPA-C, present and scrubbed throughout the case, critical for assistance with exposure, retraction, instrumentation, and closure.  OPERATIVE IMPLANTS: Synthes trochanteric femoral nail with interlocking helical blade into the femoral head.  PREOPERATIVE INDICATIONS:  [name] is a 80 y.o. year old who fell and suffered a hip fracture. She was brought into the ER and then admitted and optimized and then elected for surgical intervention.    The risks benefits and alternatives were discussed with the patient including but not limited to the risks of nonoperative treatment, versus surgical intervention including infection, bleeding, nerve injury, malunion, nonunion, hardware prominence, hardware failure, need for hardware removal, blood clots, cardiopulmonary complications, morbidity, mortality, among others, and they were willing to proceed.    OPERATIVE PROCEDURE:  The patient was brought to the operating room and placed in the supine position. General anesthesia was administered, with a foley. She was placed on the fracture table.  Closed reduction was performed under C-arm guidance. The length of the femur was also measured using fluoroscopy. Time out was then performed after sterile prep and drape. She received preoperative antibiotics vancomycin and ancef.  Incision was made proximal to the greater trochanter. A guidewire was placed in the appropriate position. Confirmation was made on AP and lateral views. The above-named nail was opened. I opened the proximal femur with a reamer. I then placed the nail by hand but it wouldn't go down so I reamed to an 12.5 and placed the nail easily down.  Once the nail was  completely seated, I placed a guidepin into the femoral head into the center center position. I measured the length, and then reamed the lateral cortex and up into the head. I then placed the helical blade. Slight compression was applied. Anatomic fixation achieved. Bone quality was mediocre.  I then secured the proximal interlocking bolt, and took off a half a turn, and then removed the instruments, and took final C-arm pictures AP and lateral the entire length of the leg. Anatomic reconstruction was achieved, and the wounds were irrigated copiously and closed with Vicryl followed by Steri-Strips and sterile gauze for the skin. The patient was awakened and returned to PACU in stable and satisfactory condition. There no complications and the patient tolerated the procedure well.  She will be weightbearing as tolerated, and will be on Lovenox bridging back to coumadin after discharge.   Marchia Bond, M.D.

## 2014-03-02 NOTE — Progress Notes (Signed)
ANTICOAGULATION CONSULT NOTE - Initial Consult  Pharmacy Consult for Coumadin Indication: atrial fibrillation  Allergies  Allergen Reactions  . Famotidine Other (See Comments)    Doesn't remember   . Penicillins     Doesn't remember   . Sulfa Antibiotics Other (See Comments)    Doesn't remember   . Tetracyclines & Related Other (See Comments)    Doesn't remember     Patient Measurements: Height: 5\' 2"  (157.5 cm) Weight: 92 lb 9.6 oz (42.003 kg) IBW/kg (Calculated) : 50.1 Heparin Dosing Weight: n/a  Vital Signs: Temp: 97.3 F (36.3 C) (12/21 1523) Temp Source: Oral (12/21 1222) BP: 123/38 mmHg (12/21 1615) Pulse Rate: 59 (12/21 1615)  Labs:  Recent Labs  02/28/14 1220 03/01/14 0320 03/02/14 0811  HGB 12.8 12.0 13.7  HCT 39.0 36.7 41.0  PLT 317 287 282  LABPROT 26.7* 27.9* 14.1  INR 2.44* 2.58* 1.08  CREATININE 0.76 0.90 0.64    Estimated Creatinine Clearance: 26.7 mL/min (by C-G formula based on Cr of 0.64).   Medical History: Past Medical History  Diagnosis Date  . Atrial fibrillation with controlled ventricular response   . Hypothyroidism   . Hypertension   . Diastolic CHF, acute on chronic   . Valvular heart disease     with severe mr,tr,and mod pulmonary regurgitation  . Breast cancer     Medications:  Scheduled:  .  ceFAZolin (ANCEF) IV  2 g Intravenous 4 times per day  . Chlorhexidine Gluconate Cloth  6 each Topical Q0600  . digoxin  125 mcg Oral Daily  . diltiazem  300 mg Oral Daily  . docusate sodium  100 mg Oral BID  . dorzolamide-timolol  1 drop Both Eyes BID  . [START ON 03/03/2014] enoxaparin (LOVENOX) injection  30 mg Subcutaneous Q24H  . latanoprost  1 drop Both Eyes QHS  . levothyroxine  100 mcg Oral QAC breakfast  . metoprolol succinate  50 mg Oral BID  . mupirocin ointment   Nasal BID  . senna  1 tablet Oral BID  . vancomycin  1,000 mg Intravenous Q12H    Assessment: 80 yo female on chronic Coumadin for afib admitted after  fall, s/p OR for IM nail in hip.  Pharmacy asked to resume Coumadin this evening.  Today's INR 1.08 after vitamin K reversal in anticipation of surgery.  INR was therapeutic on admission at 2.44 on home dose of 2.5 mg daily.  Goal of Therapy:  INR 2-3 Monitor platelets by anticoagulation protocol: Yes   Plan:  1. Coumadin 2.5 mg x 1 tonight. 2. Daily PT/INR. 3. Lovenox bridge until INR therapeutic.  Uvaldo Rising, BCPS  Clinical Pharmacist Pager [phone]  03/02/2014 5:15 PM

## 2014-03-02 NOTE — Clinical Social Work Psychosocial (Signed)
Clinical Social Work Department BRIEF PSYCHOSOCIAL ASSESSMENT 03/02/2014  Patient:  [name], [name]     Account Number:  [id number]     Admit date:  02/28/2014  Clinical Social Worker:  Delrae Sawyers  Date/Time:  03/02/2014 04:26 PM  Referred by:  Physician  Date Referred:  03/02/2014 Referred for  SNF Placement   Other Referral:   none.   Interview type:  Family Other interview type:   none.    PSYCHOSOCIAL DATA Living Status:  ALONE Admitted from facility:   Level of care:   Primary support name:  [name] Primary support relationship to patient:  CHILD, ADULT Degree of support available:   Strong support system.    CURRENT CONCERNS Current Concerns  Post-Acute Placement   Other Concerns:   none.    SOCIAL WORK ASSESSMENT / PLAN CSW received call from pt's son, [name], regarding discharge disposition. Pt's son informed CSW pt's family would prefer for pt to be discharged to [facility] and [facility] once pt is medically stable for discharge. CSW updated [employer]. CSW to continue to follow and assist with discharge planning needs.   Assessment/plan status:  Psychosocial Support/Ongoing Assessment of Needs Other assessment/ plan:   none.   Information/referral to community resources:   Pt to be discharged to [place] [facility] [place] [facility].    PATIENT'S/FAMILY'S RESPONSE TO PLAN OF CARE: Pt's family understanding and agreeable to CSW plan of care. Pt's son expresed no further questions or concerns at this time.       Lubertha Sayres, [place] ([phone]) Licensed Clinical Social Worker Orthopedics [phone]) and Surgical [phone])

## 2014-03-02 NOTE — Progress Notes (Signed)
Pt seen and examined with Dr. Randell Patient. Case d/w residents and plan was formulated on rounds. I agree with findings and plan as documented in resident note.  Assessment and Plan: 80 y/o female with R intertrochanteric hip fracture s/p mechanical fall  R intertrochanteric hip fx: - Pt is now s/p IM nail fixation in OR - c/w pain control - Pt is WBAT per ortho note - PT/OT eval in AM - Hg stable. Will monitor  Afib: - INR now subtherapeutic s/p vitamin K for surgery - Will start therapeutic lovenox till INR therapeutic - c/w toprol and cardizem  Diastolic CHF: - c/w digoxin and toprol - Will consider resuming imdur, lasix on discharge if BP stable  D/C planning to [facility] once cleared by ortho

## 2014-03-03 ENCOUNTER — Encounter (HOSPITAL_COMMUNITY): Payer: Self-pay | Admitting: Orthopedic Surgery

## 2014-03-03 DIAGNOSIS — I255 Ischemic cardiomyopathy: Secondary | ICD-10-CM

## 2014-03-03 DIAGNOSIS — E035 Myxedema coma: Secondary | ICD-10-CM

## 2014-03-03 DIAGNOSIS — I10 Essential (primary) hypertension: Secondary | ICD-10-CM

## 2014-03-03 LAB — BASIC METABOLIC PANEL
ANION GAP: 9 (ref 5–15)
BUN: 11 mg/dL (ref 6–23)
CALCIUM: 8 mg/dL — AB (ref 8.4–10.5)
CO2: 23 mmol/L (ref 19–32)
Chloride: 102 mEq/L (ref 96–112)
Creatinine, Ser: 0.71 mg/dL (ref 0.50–1.10)
GFR calc Af Amer: 81 mL/min — ABNORMAL LOW (ref >90)
GFR calc non Af Amer: 70 mL/min — ABNORMAL LOW (ref >90)
GLUCOSE: 162 mg/dL — AB (ref 70–99)
POTASSIUM: 3.7 mmol/L (ref 3.5–5.1)
SODIUM: 134 mmol/L — AB (ref 135–145)

## 2014-03-03 LAB — CBC
HEMATOCRIT: 34.2 % — AB (ref 36.0–46.0)
Hemoglobin: 11.4 g/dL — ABNORMAL LOW (ref 12.0–15.0)
MCH: 30.3 pg (ref 26.0–34.0)
MCHC: 33.3 g/dL (ref 30.0–36.0)
MCV: 91 fL (ref 78.0–100.0)
Platelets: 240 10*3/uL (ref 150–400)
RBC: 3.76 MIL/uL — AB (ref 3.87–5.11)
RDW: 15.3 % (ref 11.5–15.5)
WBC: 11.8 10*3/uL — AB (ref 4.0–10.5)

## 2014-03-03 LAB — PROTIME-INR
INR: 1.2 (ref 0.00–1.49)
Prothrombin Time: 15.3 seconds — ABNORMAL HIGH (ref 11.6–15.2)

## 2014-03-03 MED ORDER — WARFARIN SODIUM 4 MG PO TABS
4.0000 mg | ORAL_TABLET | Freq: Once | ORAL | Status: AC
  Administered 2014-03-03: 4 mg via ORAL
  Filled 2014-03-03: qty 1

## 2014-03-03 NOTE — Progress Notes (Signed)
Pt seen and examined with Dr. Randell Patient. Case d/w residents and plan was formulated on rounds. I agree with findings and plan as documented in resident note.  Assessment and Plan: 80 y/o female with R intertrochanteric hip fracture s/p mechanical fall  R intertrochanteric hip fx: - Pt is now s/p IM nail fixation in OR - c/w pain control - Pt is WBAT per ortho note - PT eval appreciated. Pt to be dc'd to SNF once bed available - Hg mildly decreased post op. Will monitor  Afib: - INR now subtherapeutic s/p vitamin K for surgery -  C/wtherapeutic lovenox till INR therapeutic - c/w toprol and cardizem  Diastolic CHF: - c/w digoxin and toprol - Will consider resuming imdur, lasix on discharge but BP currently low 100s. Will monitor  D/C planning to SNF when bed available

## 2014-03-03 NOTE — Clinical Social Work Placement (Signed)
Clinical Social Work Department CLINICAL SOCIAL WORK PLACEMENT NOTE 03/03/2014  Patient:  [name], [name]  Account Number:  [id number] Admit date:  02/28/2014  Clinical Social Worker:  Delrae Sawyers  Date/time:  03/03/2014 10:49 AM  Clinical Social Work is seeking post-discharge placement for this patient at the following level of care:   [facility]   (*CSW will update this form in Epic as items are completed)   03/03/2014  Patient/family provided with [facility] Department of Clinical Social Work's list of facilities offering this level of care within the geographic area requested by the patient (or if unable, by the patient's family).  03/03/2014  Patient/family informed of their freedom to choose among providers that offer the needed level of care, that participate in Medicare, Medicaid or managed care program needed by the patient, have an available bed and are willing to accept the patient.  03/03/2014  Patient/family informed of [facility]' ownership interest in [facility], as well as of the fact that they are under no obligation to receive care at this facility.  PASARR submitted to EDS on 03/03/2014 PASARR number received on 03/03/2014  FL2 transmitted to all facilities in geographic area requested by pt/family on  03/03/2014 FL2 transmitted to all facilities within larger geographic area on   Patient informed that his/her managed care company has contracts with or will negotiate with  certain facilities, including the following:     Patient/family informed of bed offers received:  03/03/2014 Patient chooses bed at [facility] Physician recommends and patient chooses bed at    Patient to be transferred to [facility] on   Patient to be transferred to facility by [facility] Patient and family notified of transfer on  Name of family member notified:    The following physician request were entered in  Epic:   Additional Comments:  [facility] [place] ([phone]) Licensed Clinical Social Worker Orthopedics [phone]) and Surgical [phone])

## 2014-03-03 NOTE — Progress Notes (Signed)
[facility] for Coumadin Indication: atrial fibrillation  Allergies  Allergen Reactions  . Famotidine Other (See Comments)    Doesn't remember   . Penicillins     Doesn't remember   . Sulfa Antibiotics Other (See Comments)    Doesn't remember   . Tetracyclines & Related Other (See Comments)    Doesn't remember    Labs:  Recent Labs  03/01/14 0320 03/02/14 0811 03/03/14 0550  HGB 12.0 13.7 11.4*  HCT 36.7 41.0 34.2*  PLT 287 282 240  LABPROT 27.9* 14.1 15.3*  INR 2.58* 1.08 1.20  CREATININE 0.90 0.64 0.71    Estimated Creatinine Clearance: 26.7 mL/min (by C-G formula based on Cr of 0.71).  Assessment: 80 yo female on chronic Coumadin for afib admitted after fall, s/p OR for IM nail in hip.  Pharmacy asked to resume Coumadin this evening.  Today's INR 1.08 after vitamin K reversal in anticipation of surgery.  INR was therapeutic on admission at 2.44 on home dose of 2.5 mg daily.  Goal of Therapy:  INR 2-3 Monitor platelets by anticoagulation protocol: Yes   Plan:  1. Coumadin 4 mg x 1 tonight. 2. Daily PT/INR. 3. Lovenox bridge until INR therapeutic.  Thank you. Anette Guarneri, PharmD [phone]   03/03/2014 8:57 AM

## 2014-03-03 NOTE — Progress Notes (Signed)
OT Cancellation Note and Discharge  Patient Details Name: [name] MRN: [id number] DOB: 07-11-1914   Cancelled Treatment:    Reason Eval/Treat Not Completed: Other (comment) Pt is Medicare and current D/C plan is SNF. No apparent immediate acute care OT needs, therefore will defer OT to SNF. If OT eval is needed please call Acute Rehab Dept. at [phone] or text page OT at [phone].     Almon Register [phone] 03/03/2014, 10:13 AM

## 2014-03-03 NOTE — Discharge Summary (Signed)
Name: [name] MRN: [id number] DOB: 13-Oct-1914 80 y.o. PCP: Leonard Downing, MD  Date of Admission: 02/28/2014 11:10 AM Date of Discharge: 03/04/2014 Attending Physician: Aldine Contes, MD  Discharge Diagnosis:  Principal Problem:   Hip fracture Active Problems:   Hypothyroidism   Hypertension   Atrial fibrillation   Diastolic CHF  Discharge Medications:   Medication List    TAKE these medications        acetaminophen 325 MG tablet  Commonly known as:  TYLENOL  Take 2 tablets (650 mg total) by mouth every 6 (six) hours as needed for mild pain or moderate pain.     bisacodyl 10 MG suppository  Commonly known as:  DULCOLAX  Place 1 suppository (10 mg total) rectally daily as needed for moderate constipation.     digoxin 0.125 MG tablet  Commonly known as:  LANOXIN  Take 1 tablet (125 mcg total) by mouth daily.     diltiazem 300 MG 24 hr capsule  Commonly known as:  CARDIZEM CD  Take 1 capsule (300 mg total) by mouth daily.     dorzolamide-timolol 22.3-6.8 MG/ML ophthalmic solution  Commonly known as:  COSOPT  Place 1 drop into both eyes 2 (two) times daily.     enoxaparin 30 MG/0.3ML injection  Commonly known as:  LOVENOX  Inject 0.3 mLs (30 mg total) into the skin daily. Discontinue when INR between 2-3     ferrous sulfate 325 (65 FE) MG tablet  Take 325 mg by mouth daily with breakfast.     furosemide 40 MG tablet  Commonly known as:  LASIX  Take 1 tablet (40 mg total) by mouth as directed.     HYDROcodone-acetaminophen 5-325 MG per tablet  Commonly known as:  NORCO  Take 1-2 tablets by mouth every 6 (six) hours as needed for moderate pain. MAXIMUM TOTAL ACETAMINOPHEN DOSE IS 4000 MG PER DAY     isosorbide mononitrate 60 MG 24 hr tablet  Commonly known as:  IMDUR  Take 1 tablet (60 mg total) by mouth daily.     latanoprost 0.005 % ophthalmic solution  Commonly known as:  XALATAN  Place 1 drop into both eyes at bedtime.     levothyroxine 100 MCG tablet  Commonly known as:  SYNTHROID, LEVOTHROID  Take 1 tablet (100 mcg total) by mouth daily.     LUTEIN PO  Take 1 tablet by mouth daily.     metoprolol succinate 100 MG 24 hr tablet  Commonly known as:  TOPROL-XL  TAKE A  1/2 TABLET  TWICE A DAY, with or immediately following a meal.     multivitamin tablet  Take 1 tablet by mouth daily.     nitroGLYCERIN 0.4 MG SL tablet  Commonly known as:  NITROSTAT  Place 1 tablet (0.4 mg total) under the tongue every 5 (five) minutes as needed. For chest pains     polyethylene glycol packet  Commonly known as:  MIRALAX / GLYCOLAX  Take 17 g by mouth daily as needed for mild constipation.     potassium chloride SA 20 MEQ tablet  Commonly known as:  K-DUR,KLOR-CON  Take 1 tablet (20 mEq total) by mouth every other day.     sennosides-docusate sodium 8.6-50 MG tablet  Commonly known as:  SENOKOT-S  Take 2 tablets by mouth daily.     vitamin C 500 MG tablet  Commonly known as:  ASCORBIC ACID  Take 500 mg by mouth daily.  vitamin E 200 UNIT capsule  Take 200 Units by mouth daily.     warfarin 3 MG tablet  Commonly known as:  COUMADIN  Take 1 tablet (3 mg total) by mouth every evening.        Disposition and follow-up:   [name] was discharged from [facility] in Stable condition.  At the hospital follow up visit please address:  1.  R intertrochanteric hip fracture: 2/2 mechanical fall. Underwent intertrochanteric intramedullary nail of R hip on 03/02/2014. Her INR was corrected for her surgery. She was discharged on Lovenox bridge and coumadin.  2.  Labs / imaging needed at time of follow-up: BMP, PT/INR, CBC  3.  Pending labs/ test needing follow-up: None  Follow-up Appointments: Follow-up Information    Follow up with Leonard Downing, MD. Schedule an appointment as soon as possible for a visit in 1 week.   Specialty:  Family Medicine   Why:  Please call and  schedule appointment for hospital follow up when patient close to discharge home from skilled nursing facility.   Contact information:   [address] [place] [address] [phone]       Follow up with Johnny Bridge, MD.   Specialty:  Orthopedic Surgery   Why:  Please call and schedule surgery follow up appointment.   Contact information:   [address] [place] [address] [phone]       Discharge Instructions: Discharge Instructions    Call MD for:  difficulty breathing, headache or visual disturbances    Complete by:  As directed      Call MD for:  persistant dizziness or light-headedness    Complete by:  As directed      Call MD for:  persistant nausea and vomiting    Complete by:  As directed      Call MD for:  redness, tenderness, or signs of infection (pain, swelling, redness, odor or green/yellow discharge around incision site)    Complete by:  As directed      Call MD for:  severe uncontrolled pain    Complete by:  As directed      Call MD for:  temperature >100.4    Complete by:  As directed      Diet - low sodium heart healthy    Complete by:  As directed      Increase activity slowly    Complete by:  As directed      Weight bearing as tolerated    Complete by:  As directed            Consultations: Treatment Team:  Johnny Bridge, MD  Procedures Performed:  Dg Pelvis 1-2 Views  02/28/2014   CLINICAL DATA:  Pt fell in her kitchen this morning. Pain to right hip and right femur, laceration to right ring finger, pain to right shoulder.  EXAM: PELVIS - 1-2 VIEW  COMPARISON:  CT 05/29/2013  FINDINGS: Hips are located. No pelvic fracture sacral fracture. No femoral neck fracture.  IMPRESSION: No evidence of pelvic fracture or hip fracture.   Electronically Signed   By: Suzy Bouchard M.D.   On: 02/28/2014 14:18   Dg Shoulder Right  02/28/2014   CLINICAL DATA:  Pt fell in her kitchen this morning. Pain to right hip and  right femur, laceration to right ring finger, pain to right shoulder.  EXAM: RIGHT SHOULDER - 2+ VIEW  COMPARISON:  None.  FINDINGS: There is  no fracture or dislocation. There are mild degenerative changes of the acromioclavicular joint.  IMPRESSION: No acute osseous injury of the right shoulder.   Electronically Signed   By: Kathreen Devoid   On: 02/28/2014 14:16   Dg Femur Right  03/02/2014   CLINICAL DATA:  Right hip fracture  EXAM: RIGHT FEMUR - 2 VIEW; DG C-ARM 61-120 MIN  COMPARISON:  02/28/2014  FLUOROSCOPY TIME:  50 seconds  FINDINGS: Five spot films were obtained intraoperatively and reveal placement of a medullary rod with proximal fixation screw traversing the femoral neck. Fracture fragments are in anatomic alignment.  IMPRESSION: ORIF of proximal right femoral fracture   Electronically Signed   By: Inez Catalina M.D.   On: 03/02/2014 15:35   Dg Femur Right  02/28/2014   CLINICAL DATA:  Pain after fall earlier today  EXAM: RIGHT FEMUR - 2 VIEW  COMPARISON:  None.  FINDINGS: Frontal and lateral views were obtained. There is a nondisplaced spiral fracture extending from the greater trochanter to the medial proximal femoral diaphysis. No other fracture. No dislocation. There is osteoarthritic change in the right hip and knee joints.  IMPRESSION: Nondisplaced spiral fracture extending from right greater trochanter to medial proximal femoral diaphysis. No dislocation. Areas of osteoarthritic change.   Electronically Signed   By: Lowella Grip M.D.   On: 02/28/2014 14:19   Ct Head Wo Contrast  02/28/2014   CLINICAL DATA:  Status post fall, right hip pain, right arm pain  EXAM: CT HEAD WITHOUT CONTRAST  CT CERVICAL SPINE WITHOUT CONTRAST  TECHNIQUE: Multidetector CT imaging of the head and cervical spine was performed following the standard protocol without intravenous contrast. Multiplanar CT image reconstructions of the cervical spine were also generated.  COMPARISON:  None.  FINDINGS: CT HEAD  FINDINGS  There is no evidence of mass effect, midline shift, or extra-axial fluid collections. There is no evidence of a space-occupying lesion or intracranial hemorrhage. There is no evidence of a cortical-based area of acute infarction. There is generalized cerebral atrophy. There is periventricular white matter low attenuation likely secondary to microangiopathy.  The ventricles and sulci are appropriate for the patient's age. The basal cisterns are patent.  Visualized portions of the orbits are unremarkable. The visualized portions of the paranasal sinuses and mastoid air cells are unremarkable. Cerebrovascular atherosclerotic calcifications are noted.  The osseous structures are unremarkable.  CT CERVICAL SPINE FINDINGS  The alignment is anatomic. The vertebral body heights are maintained. There is no acute fracture. There is no static listhesis. The prevertebral soft tissues are normal. The intraspinal soft tissues are not fully imaged on this examination due to poor soft tissue contrast, but there is no gross soft tissue abnormality.  There is diffuse degenerative disc disease throughout the cervical spine most severe at C4-5, C5-6 and C6-7. There are small broad-based disc osteophyte complexes at C3-4, C4-5, C5-6 and C6-7. There is bilateral facet arthropathy at C2-3. There is bilateral facet arthropathy and uncovertebral degenerative changes at C3-4 with left foraminal encroachment. There is bilateral facet arthropathy and uncovertebral degenerative change at C4-5 with bilateral foraminal encroachment, right greater than left. There is right uncovertebral degenerative change and facet arthropathy resulting in right foraminal encroachment at C5-6. Bilateral facet arthropathy at C6-7 and C7-T1.  There is osteoarthritis of bilateral temporomandibular joints.  Bandlike area of airspace disease in the right lung apex adjacent to the azygos fissure with a linear hyperdensity within the soft tissue component. The  overall appearance is unchanged compared  with prior PET-CT dated 08/07/2008.  There is bilateral carotid artery atherosclerosis.  IMPRESSION: 1. No acute intracranial pathology. 2. No acute osseous injury of the cervical spine. 3. Cervical spine spondylosis as detailed above.   Electronically Signed   By: Kathreen Devoid   On: 02/28/2014 13:31   Ct Cervical Spine Wo Contrast  02/28/2014   CLINICAL DATA:  Status post fall, right hip pain, right arm pain  EXAM: CT HEAD WITHOUT CONTRAST  CT CERVICAL SPINE WITHOUT CONTRAST  TECHNIQUE: Multidetector CT imaging of the head and cervical spine was performed following the standard protocol without intravenous contrast. Multiplanar CT image reconstructions of the cervical spine were also generated.  COMPARISON:  None.  FINDINGS: CT HEAD FINDINGS  There is no evidence of mass effect, midline shift, or extra-axial fluid collections. There is no evidence of a space-occupying lesion or intracranial hemorrhage. There is no evidence of a cortical-based area of acute infarction. There is generalized cerebral atrophy. There is periventricular white matter low attenuation likely secondary to microangiopathy.  The ventricles and sulci are appropriate for the patient's age. The basal cisterns are patent.  Visualized portions of the orbits are unremarkable. The visualized portions of the paranasal sinuses and mastoid air cells are unremarkable. Cerebrovascular atherosclerotic calcifications are noted.  The osseous structures are unremarkable.  CT CERVICAL SPINE FINDINGS  The alignment is anatomic. The vertebral body heights are maintained. There is no acute fracture. There is no static listhesis. The prevertebral soft tissues are normal. The intraspinal soft tissues are not fully imaged on this examination due to poor soft tissue contrast, but there is no gross soft tissue abnormality.  There is diffuse degenerative disc disease throughout the cervical spine most severe at C4-5, C5-6  and C6-7. There are small broad-based disc osteophyte complexes at C3-4, C4-5, C5-6 and C6-7. There is bilateral facet arthropathy at C2-3. There is bilateral facet arthropathy and uncovertebral degenerative changes at C3-4 with left foraminal encroachment. There is bilateral facet arthropathy and uncovertebral degenerative change at C4-5 with bilateral foraminal encroachment, right greater than left. There is right uncovertebral degenerative change and facet arthropathy resulting in right foraminal encroachment at C5-6. Bilateral facet arthropathy at C6-7 and C7-T1.  There is osteoarthritis of bilateral temporomandibular joints.  Bandlike area of airspace disease in the right lung apex adjacent to the azygos fissure with a linear hyperdensity within the soft tissue component. The overall appearance is unchanged compared with prior PET-CT dated 08/07/2008.  There is bilateral carotid artery atherosclerosis.  IMPRESSION: 1. No acute intracranial pathology. 2. No acute osseous injury of the cervical spine. 3. Cervical spine spondylosis as detailed above.   Electronically Signed   By: Kathreen Devoid   On: 02/28/2014 13:31   Pelvis Portable  03/02/2014   CLINICAL DATA:  Status post right femoral fixation  EXAM: PORTABLE PELVIS 1-2 VIEWS  COMPARISON:  02/28/2014  FINDINGS: A medullary rod is noted in the right femur with proximal fixation screw. The fracture fragment this are in anatomic alignment. No other focal abnormality is seen.  IMPRESSION: Status post ORIF of proximal right femoral fracture   Electronically Signed   By: Inez Catalina M.D.   On: 03/02/2014 16:01   Dg Humerus Right  02/28/2014   CLINICAL DATA:  Patient fell intention this morning. Pain to right hip and right femur. Laceration to ring finger.  EXAM: RIGHT HUMERUS - 2+ VIEW  COMPARISON:  None.  FINDINGS: There is no fracture or dislocation on the right humerus.  IMPRESSION: No fracture or dislocation.   Electronically Signed   By: Suzy Bouchard M.D.   On: 02/28/2014 14:16   Dg Hand Complete Right  02/28/2014   CLINICAL DATA:  Fall, history of dog bite to the right fourth digit on 02/17/2014 by report.  EXAM: RIGHT HAND - COMPLETE 3+ VIEW  COMPARISON:  None.  FINDINGS: There is loss of the joint space at the proximal interphalangeal joint of the third digit. There bony erosion of the base of the middle phalanx of the third digit at the proximal interphalangeal joint. There is soft tissue swelling around venous proximal interphalangeal joint. There is mild radial subluxation of the middle phalanx.  There is soft tissue swelling at the proximal interphalangeal joint of the fourth digit. No evidence of fracture of fourth digit.  There is joint space loss of the distal interphalangeal joint of the second digit with sclerosis. Similar findings at the distal interphalangeal joint of the fifth digit. No evidence of acute fracture.  IMPRESSION: 1. No evidence of acute fracture. 2. Severe erosive arthropathy about the proximal interphalangeal joint of the third digit versus osteomyelitis. 3. Soft tissue swelling about the proximal interphalangeal joint of the fourth digit. 4. Osteoarthritis of the distant interphalangeal joints of the first and fifth digits. Findings conveyed Nanci Pina on 02/28/2014  at14:29.   Electronically Signed   By: Suzy Bouchard M.D.   On: 02/28/2014 14:29   Dg C-arm 1-60 Min  03/02/2014   CLINICAL DATA:  Right hip fracture  EXAM: RIGHT FEMUR - 2 VIEW; DG C-ARM 61-120 MIN  COMPARISON:  02/28/2014  FLUOROSCOPY TIME:  50 seconds  FINDINGS: Five spot films were obtained intraoperatively and reveal placement of a medullary rod with proximal fixation screw traversing the femoral neck. Fracture fragments are in anatomic alignment.  IMPRESSION: ORIF of proximal right femoral fracture   Electronically Signed   By: Inez Catalina M.D.   On: 03/02/2014 15:35    Admission HPI: Ms. [name] is a 80 year old woman with history  of atrial fibrillation on coumadin, hypothyroidism, HTN, diastolic CHF, valvular heart disease presenting after fall. She was standing in the kitchen at the time. She lost her balance and fell. Denies dizziness, lightheadedness, chest pain, palpitations, incontinence of bowel or bladder. No LOC or head trauma. She does not have a history of falls. She reports pain in her right shoulder and R hip. She has a laceration on her R fourth finger.   Denies anorexia, fevers, chills, vision changes, cough, shortness of breath, nausea/vomiting, constipation, diarrhea, hematochezia, melena, dysuria, hematuria, rash, hx of VTE, focal weakness. She has occasional mild intermittent generalized abdominal pain. Chronic occasional leg edema. Occasional bilateral feet paresthesias.   Hospital Course by problem list: Principal Problem:   Hip fracture Active Problems:   Hypothyroidism   Hypertension   Atrial fibrillation   Diastolic CHF   1. R intertrochanteric hip fracture: 2/2 mechanical fall. Nondisplaced spiral fracture extending from right greater trochanter to medial proximal femoral diaphysis seen on XR R femur. No dislocation. XR pelvis, R shoulder, R humerus, R hand with no acute fractures or dislocation. CT head, c spine, without acute abnormality. Ortho consulted. Her coumadin was held and she was given IV vitamin K 5mg  x 1. Her INR normalized and she proceeded to the operating room for intertrochanteric intramedullary nail of R hip on 03/02/2014. The surgery was uncomplicated and the patient admitted to the general medical surgical floor for routine post-operative care. Her post-surgical course  was uneventful, and the patient ready for discharge on post-operative day 2. She was restarted on warfarin with a Lovenox bridge. At the time of discharge, patient was tolerating a heart healthy diet with returned bowel function, voiding freely with adequate urine output, and reporting good pain control with PO  medications with instructions for outpatient follow up with orthopedic surgery. She is weight bearing as tolerated.  2. Atrial Fibrillation: on Coumadin at home. CHADS2 score 3. Rate controlled. On Cardizem CD 300mg  daily, Toprol XL 50mg  BID at home. She was continued on her home medications. Her coumadin was held for her procedure. It was restarted after her procedure. She was discharged on Lovenox bridge to Coumadin.  3. Ischemic cardiomyopathy, diastolic CHF: echocardiogram in 2009 showing an ejection fraction of 60-65% with mild aortic stenosis and moderate pulmonary hypertension and moderate mitral regurgitation. On digoxin 0.125mg  daily, lasix 40mg , imdur 60mg  daily, Toprol XL 50mg  BID at home. She was continued on her home medications.  4. HTN: BP 119/50 on admission. On lasix 40mg , imdur 60mg  daily, Toprol XL 50mg  BID at home. Medications as above.  5. Hypothyroidism: She was continued on her home synthroid 123mcg daily.  Discharge Vitals:   BP 123/45 mmHg  Pulse 72  Temp(Src) 97.5 F (36.4 C) (Oral)  Resp 18  Ht 5\' 2"  (1.575 m)  Wt 92 lb 9.6 oz (42.003 kg)  BMI 16.93 kg/m2  SpO2 98%  Discharge Labs:  Results for orders placed or performed during the hospital encounter of 02/28/14 (from the past 24 hour(s))  CBC     Status: Abnormal   Collection Time: 03/04/14  4:26 AM  Result Value Ref Range   WBC 10.5 4.0 - 10.5 K/uL   RBC 3.86 (L) 3.87 - 5.11 MIL/uL   Hemoglobin 11.4 (L) 12.0 - 15.0 g/dL   HCT 34.8 (L) 36.0 - 46.0 %   MCV 90.2 78.0 - 100.0 fL   MCH 29.5 26.0 - 34.0 pg   MCHC 32.8 30.0 - 36.0 g/dL   RDW 15.3 11.5 - 15.5 %   Platelets 283 150 - 400 K/uL  Basic metabolic panel     Status: Abnormal   Collection Time: 03/04/14  4:26 AM  Result Value Ref Range   Sodium 132 (L) 135 - 145 mmol/L   Potassium 4.4 3.5 - 5.1 mmol/L   Chloride 98 96 - 112 mEq/L   CO2 26 19 - 32 mmol/L   Glucose, Bld 146 (H) 70 - 99 mg/dL   BUN 17 6 - 23 mg/dL   Creatinine, Ser 0.89 0.50 -  1.10 mg/dL   Calcium 8.1 (L) 8.4 - 10.5 mg/dL   GFR calc non Af Amer 53 (L) >90 mL/min   GFR calc Af Amer 61 (L) >90 mL/min   Anion gap 8 5 - 15  Protime-INR     Status: Abnormal   Collection Time: 03/04/14  4:26 AM  Result Value Ref Range   Prothrombin Time 15.5 (H) 11.6 - 15.2 seconds   INR 1.22 0.00 - 1.49    Signed: Jacques Earthly, MD 03/04/2014, 11:00 AM    Services Ordered on Discharge: None Equipment Ordered on Discharge: None

## 2014-03-03 NOTE — Progress Notes (Signed)
     Subjective:  Patient reports pain as mild.  She is alert, interactive, sitting up in a chair eating breakfast.  Objective:   VITALS:   Filed Vitals:   03/03/14 0000 03/03/14 0124 03/03/14 0305 03/03/14 0640  BP:  100/65  121/53  Pulse:  85  80  Temp:  97.8 F (36.6 C)  98.6 F (37 C)  TempSrc:  Oral  Oral  Resp: 18  18   Height:      Weight:      SpO2: 97% 99% 99% 100%    Neurologically intact Dorsiflexion/Plantar flexion intact Incision: dressing C/D/I   Lab Results  Component Value Date   WBC 11.8* 03/03/2014   HGB 11.4* 03/03/2014   HCT 34.2* 03/03/2014   MCV 91.0 03/03/2014   PLT 240 03/03/2014   BMET    Component Value Date/Time   NA 134* 03/03/2014 0550   K 3.7 03/03/2014 0550   CL 102 03/03/2014 0550   CO2 23 03/03/2014 0550   GLUCOSE 162* 03/03/2014 0550   BUN 11 03/03/2014 0550   CREATININE 0.71 03/03/2014 0550   CALCIUM 8.0* 03/03/2014 0550   GFRNONAA 70* 03/03/2014 0550   GFRAA 81* 03/03/2014 0550     Assessment/Plan: 1 Day Post-Op   Principal Problem:   Hip fracture Active Problems:   Hypothyroidism   Hypertension   Atrial fibrillation   Diastolic CHF   Advance diet Up with therapy Discharge to SNF possible discharge home if she is capable, however I would predicted skilled nursing placement in the next day or 2. Weightbearing as tolerated, Lovenox bridging to Coumadin, Tylenol or hydrocodone for pain.   Umberto Pavek P 03/03/2014, 10:13 AM   Marchia Bond, MD Cell [phone]

## 2014-03-03 NOTE — Progress Notes (Signed)
Subjective: Reports that she is doing well except for pain from the surgery, though it is controlled. She is in good spirits this morning. Tolerating breakfast. Denies CP or SOB.  Objective: Vital signs in last 24 hours: Filed Vitals:   03/03/14 0000 03/03/14 0124 03/03/14 0305 03/03/14 0640  BP:  100/65  121/53  Pulse:  85  80  Temp:  97.8 F (36.6 C)  98.6 F (37 C)  TempSrc:  Oral  Oral  Resp: 18  18   Height:      Weight:      SpO2: 97% 99% 99% 100%   Weight change:   Intake/Output Summary (Last 24 hours) at 03/03/14 0910 Last data filed at 03/03/14 0610  Gross per 24 hour  Intake   1000 ml  Output    825 ml  Net    175 ml   General Apperance: NAD HEENT: Normocephalic, atraumatic, PERRL, EOMI, anicteric sclera Neck: Supple, trachea midline Lungs: Clear to auscultation bilaterally. No wheezes, rhonchi or rales. Breathing comfortably on RA Chest Wall: Nontender, no deformity Heart: Regular rate and rhythm, no murmur/rub/gallop Abdomen: Soft, nontender, nondistended, no rebound/guarding Extremities: Warm and well perfused, no edema, tenderness to palpation anterior R hip and R shoulder. No obvious deformities, erythema, or joint swelling, operative site on left hip without hematoma, swelling, draining; dressing in place with no strikethrough Pulses: 2+ throughout Skin: laceration repair on R fourth finger c/d/i without surrounding erythema or drainage Neurologic: Alert and oriented x 3. CNII-XII intact. Normal strength and sensation, can move toes  Lab Results: Basic Metabolic Panel:  Recent Labs Lab 03-11-14 0811 03/03/14 0550  NA 142 134*  K 4.0 3.7  CL 100 102  CO2 27 23  GLUCOSE 151* 162*  BUN 13 11  CREATININE 0.64 0.71  CALCIUM 9.1 8.0*   CBC:  Recent Labs Lab 02/28/14 1220  03/11/2014 0811 03/03/14 0550  WBC 10.0  < > 16.4* 11.8*  NEUTROABS 8.6*  --   --   --   HGB 12.8  < > 13.7 11.4*  HCT 39.0  < > 41.0 34.2*  MCV 89.2  < > 90.3 91.0  PLT  317  < > 282 240  < > = values in this interval not displayed. Coagulation:  Recent Labs Lab 02/28/14 1220 03/01/14 0320 03/11/14 0811 03/03/14 0550  LABPROT 26.7* 27.9* 14.1 15.3*  INR 2.44* 2.58* 1.08 1.20    Micro Results: Recent Results (from the past 240 hour(s))  Surgical PCR screen     Status: Abnormal   Collection Time: 02/28/14  8:22 PM  Result Value Ref Range Status   MRSA, PCR POSITIVE (A) NEGATIVE Final    Comment: RESULT CALLED TO, READ BACK BY AND VERIFIED WITH:  TO RLIGHT(RN0 BY TCLEVELAND 02/28/2014 AT 2357    Staphylococcus aureus POSITIVE (A) NEGATIVE Final    Comment:        The Xpert SA Assay (FDA approved for NASAL specimens in patients over 37 years of age), is one component of a comprehensive surveillance program.  Test performance has been validated by [employer] for patients greater than or equal to 76 year old. It is not intended to diagnose infection nor to guide or monitor treatment. RESULT CALLED TO, READ BACK BY AND VERIFIED WITH:  TO RLIGHT(RN0 BY TCLEVELAND 02/28/2014 AT 2357    Studies/Results: Dg Femur Right  03-11-2014   CLINICAL DATA:  Right hip fracture  EXAM: RIGHT FEMUR - 2 VIEW; DG C-ARM 61-120  MIN  COMPARISON:  02/28/2014  FLUOROSCOPY TIME:  50 seconds  FINDINGS: Five spot films were obtained intraoperatively and reveal placement of a medullary rod with proximal fixation screw traversing the femoral neck. Fracture fragments are in anatomic alignment.  IMPRESSION: ORIF of proximal right femoral fracture   Electronically Signed   By: Inez Catalina M.D.   On: 03/02/2014 15:35   Pelvis Portable  03/02/2014   CLINICAL DATA:  Status post right femoral fixation  EXAM: PORTABLE PELVIS 1-2 VIEWS  COMPARISON:  02/28/2014  FINDINGS: A medullary rod is noted in the right femur with proximal fixation screw. The fracture fragment this are in anatomic alignment. No other focal abnormality is seen.  IMPRESSION: Status post ORIF of proximal  right femoral fracture   Electronically Signed   By: Inez Catalina M.D.   On: 03/02/2014 16:01   Dg C-arm 1-60 Min  03/02/2014   CLINICAL DATA:  Right hip fracture  EXAM: RIGHT FEMUR - 2 VIEW; DG C-ARM 61-120 MIN  COMPARISON:  02/28/2014  FLUOROSCOPY TIME:  50 seconds  FINDINGS: Five spot films were obtained intraoperatively and reveal placement of a medullary rod with proximal fixation screw traversing the femoral neck. Fracture fragments are in anatomic alignment.  IMPRESSION: ORIF of proximal right femoral fracture   Electronically Signed   By: Inez Catalina M.D.   On: 03/02/2014 15:35   Medications: I have reviewed the patient's current medications. Scheduled Meds: . Chlorhexidine Gluconate Cloth  6 each Topical Q0600  . digoxin  125 mcg Oral Daily  . diltiazem  300 mg Oral Daily  . docusate sodium  100 mg Oral BID  . dorzolamide-timolol  1 drop Both Eyes BID  . enoxaparin (LOVENOX) injection  30 mg Subcutaneous Q24H  . latanoprost  1 drop Both Eyes QHS  . levothyroxine  100 mcg Oral QAC breakfast  . metoprolol succinate  50 mg Oral BID  . mupirocin ointment   Nasal BID  . senna  1 tablet Oral BID  . warfarin  4 mg Oral ONCE-1800  . Warfarin - Pharmacist Dosing Inpatient   Does not apply q1800   Continuous Infusions: . 0.45 % NaCl with KCl 20 mEq / L 75 mL/hr at 03/02/14 2009  . lactated ringers 50 mL/hr at 03/02/14 1300   PRN Meds:.acetaminophen **OR** acetaminophen, alum & mag hydroxide-simeth, bisacodyl, HYDROcodone-acetaminophen, menthol-cetylpyridinium **OR** phenol, metoCLOPramide **OR** metoCLOPramide (REGLAN) injection, morphine injection, nitroGLYCERIN, ondansetron **OR** ondansetron (ZOFRAN) IV, polyethylene glycol Assessment/Plan: Principal Problem:   Hip fracture Active Problems:   Hypothyroidism   Hypertension   Atrial fibrillation   Diastolic CHF  R intertrochanteric hip fracture: 2/2 mechanical fall. Nondisplaced spiral fracture extending from right greater  trochanter to medial proximal femoral diaphysis seen on XR R femur. No dislocation. S/p intertrochanteric intramedullary nail of R hip. Pain well controlled. PT recommending SNF. -Advance diet as tolerated -Lovenox bridge to coumadin -Norco 5/325mg  1-2 tab Q6hr prn pain, IV morphine 0.5mg  Q2hr prn breakthrough pain -Senna 8.6mg  BID, Colace 100mg  BID -Weight bearing as tolerated per ortho.  Atrial Fibrillation: on Coumadin. CHADS2 score 3. On Cardizem CD 300mg  daily, Toprol XL 50mg  BID at home -Lovenox bridge to Coumadin -Continue home Cardizem CD 300mg  daily -Continue Toprol XL 50mg  BID  Ischemic cardiomyopathy, diastolic CHF: echocardiogram in 2009 showing an ejection fraction of 60-65% with mild aortic stenosis and moderate pulmonary hypertension and moderate mitral regurgitation. on digoxin 0.125mg  daily, lasix 40mg , imdur 60mg  daily, Toprol XL 50mg  BID at home -Hold lasix 40mg  -  Continue digoxin 0.125mg  daily -Hold Imdur 60mg  daily -Continue Toprol XL 50mg  BID -Continue home nitrostat 0.4mg  Q91min prn chest pain  HTN: BP 119/50. On lasix 40mg , imdur 60mg  daily, Toprol XL 50mg  BID at home -Medications as above  Hypothyroidism: -continue home synthroid 149mcg daily  FEN -regular diet  VTE ppx: SCDs, lovenox bridge to coumadin  Dispo: Likely d/c to SNF when bed available.  The patient does have a current PCP Redmond Pulling Arna Medici, MD) and does not need an [facility] hospital follow-up appointment after discharge.  The patient does not have transportation limitations that hinder transportation to clinic appointments.  .Services Needed at time of discharge: Y = Yes, Blank = No PT:   OT:   RN:   Equipment:   Other:     LOS: 3 days   Luan Moore, MD 03/03/2014, 9:10 AM

## 2014-03-03 NOTE — Evaluation (Signed)
Physical Therapy Evaluation Patient Details Name: [name] MRN: [id number] DOB: 30-Nov-1914 Today's Date: 03/03/2014   History of Present Illness  Pt is a 80 y.o. female adm due to fall at home. pt s/p IM nail Rt hip fx 03/02/14.  Clinical Impression  Patient is s/p above surgery resulting in functional limitations due to the deficits listed below (see PT Problem List).  Patient will benefit from skilled PT to increase their independence and safety with mobility to allow discharge to the venue listed below.  PTA pt independent and living alone. Pt very motivated and open to SNF for post acute rehab secondary to decreased caregiver (A) .      Follow Up Recommendations SNF    Equipment Recommendations  Other (comment) (TBD)    Recommendations for Other Services       Precautions / Restrictions Precautions Precautions: Fall Restrictions Weight Bearing Restrictions: Yes RLE Weight Bearing: Weight bearing as tolerated      Mobility  Bed Mobility Overal bed mobility: Needs Assistance Bed Mobility: Supine to Sit     Supine to sit: Mod assist     General bed mobility comments: (A) to bring LEs to/off EOB and elevate trunk; pt limited by pain and generalied weakness   Transfers Overall transfer level: Needs assistance Equipment used: Rolling walker (2 wheeled) Transfers: Sit to/from [facility] Sit to Stand: Min assist Stand pivot transfers: Mod assist       General transfer comment: cues for hand placement and sequencing; attempted pre-gt activities; pt with Rt LE buckling and unsteady; mod (A) to m anage RW and (A) pt with pivotal steps to chair   Ambulation/Gait             General Gait Details: pivotal steps only  Science writer    Modified Rankin (Stroke Patients Only)       Balance Overall balance assessment: Needs assistance Sitting-balance support: Feet supported;No upper extremity supported Sitting  balance-Leahy Scale: Fair Sitting balance - Comments: denied any dizziness   Standing balance support: During functional activity;Bilateral upper extremity supported Standing balance-Leahy Scale: Poor Standing balance comment: RW and (A) to balance                              Pertinent Vitals/Pain Pain Assessment: Faces Faces Pain Scale: Hurts little more Pain Location: with movement of Rt hip Pain Descriptors / Indicators: Grimacing Pain Intervention(s): Monitored during session;Premedicated before session;Repositioned    Home Living Family/patient expects to be discharged to:: Skilled nursing facility                 Additional Comments: Pt was living alone PTA; very independent; son Lives on same property but pt reports she will D/C to SNF    Prior Function Level of Independence: Independent with assistive device(s)         Comments: ambulated with cane     Hand Dominance   Dominant Hand: Right    Extremity/Trunk Assessment   Upper Extremity Assessment: Defer to OT evaluation           Lower Extremity Assessment: RLE deficits/detail      Cervical / Trunk Assessment: Normal  Communication   Communication: HOH  Cognition Arousal/Alertness: Awake/alert Behavior During Therapy: WFL for tasks assessed/performed Overall Cognitive Status: Within Functional Limits for tasks assessed  General Comments      Exercises General Exercises - Lower Extremity Ankle Circles/Pumps: AROM;Both;10 reps;Supine      Assessment/Plan    PT Assessment Patient needs continued PT services  PT Diagnosis Difficulty walking;Generalized weakness;Acute pain   PT Problem List Decreased strength;Decreased activity tolerance;Decreased balance;Decreased mobility;Decreased knowledge of use of DME;Pain  PT Treatment Interventions DME instruction;Gait training;Therapeutic activities;Therapeutic exercise;Balance training;Functional  mobility training;Neuromuscular re-education;Patient/family education   PT Goals (Current goals can be found in the Care Plan section) Acute Rehab PT Goals Patient Stated Goal: to get back to my little dog PT Goal Formulation: With patient Time For Goal Achievement: 03/10/14 Potential to Achieve Goals: Good    Frequency Min 3X/week   Barriers to discharge Decreased caregiver support lives alone    Co-evaluation               End of Session Equipment Utilized During Treatment: Gait belt Activity Tolerance: Patient tolerated treatment well Patient left: in chair;with call bell/phone within reach Nurse Communication: Mobility status;Precautions;Weight bearing status         Time: 2409-7353 PT Time Calculation (min) (ACUTE ONLY): 22 min   Charges:   PT Evaluation $Initial PT Evaluation Tier I: 1 Procedure PT Treatments $Therapeutic Activity: 8-22 mins   PT G CodesGustavus Bryant, [place]  [phone] 03/03/2014, 9:12 AM

## 2014-03-04 DIAGNOSIS — E039 Hypothyroidism, unspecified: Secondary | ICD-10-CM

## 2014-03-04 LAB — BASIC METABOLIC PANEL
ANION GAP: 8 (ref 5–15)
Anion gap: 8 (ref 5–15)
BUN: 17 mg/dL (ref 6–23)
BUN: 14 mg/dL (ref 6–23)
CALCIUM: 8.2 mg/dL — AB (ref 8.4–10.5)
CHLORIDE: 98 meq/L (ref 96–112)
CO2: 26 mmol/L (ref 19–32)
CO2: 27 mmol/L (ref 19–32)
Calcium: 8.1 mg/dL — ABNORMAL LOW (ref 8.4–10.5)
Chloride: 98 mEq/L (ref 96–112)
Creatinine, Ser: 0.89 mg/dL (ref 0.50–1.10)
Creatinine, Ser: 0.8 mg/dL (ref 0.50–1.10)
GFR calc Af Amer: 61 mL/min — ABNORMAL LOW (ref >90)
GFR calc Af Amer: 69 mL/min — ABNORMAL LOW (ref >90)
GFR calc non Af Amer: 53 mL/min — ABNORMAL LOW (ref >90)
GFR calc non Af Amer: 60 mL/min — ABNORMAL LOW (ref >90)
GLUCOSE: 164 mg/dL — AB (ref 70–99)
Glucose, Bld: 146 mg/dL — ABNORMAL HIGH (ref 70–99)
POTASSIUM: 4.4 mmol/L (ref 3.5–5.1)
Potassium: 3.5 mmol/L (ref 3.5–5.1)
SODIUM: 133 mmol/L — AB (ref 135–145)
Sodium: 132 mmol/L — ABNORMAL LOW (ref 135–145)

## 2014-03-04 LAB — CBC
HEMATOCRIT: 34.8 % — AB (ref 36.0–46.0)
Hemoglobin: 11.4 g/dL — ABNORMAL LOW (ref 12.0–15.0)
MCH: 29.5 pg (ref 26.0–34.0)
MCHC: 32.8 g/dL (ref 30.0–36.0)
MCV: 90.2 fL (ref 78.0–100.0)
Platelets: 283 10*3/uL (ref 150–400)
RBC: 3.86 MIL/uL — AB (ref 3.87–5.11)
RDW: 15.3 % (ref 11.5–15.5)
WBC: 10.5 10*3/uL (ref 4.0–10.5)

## 2014-03-04 LAB — PROTIME-INR
INR: 1.22 (ref 0.00–1.49)
PROTHROMBIN TIME: 15.5 s — AB (ref 11.6–15.2)

## 2014-03-04 MED ORDER — BISACODYL 10 MG RE SUPP: 10.0000 mg | Freq: Every day | RECTAL | Status: AC | PRN

## 2014-03-04 MED ORDER — WARFARIN SODIUM 3 MG PO TABS
3.0000 mg | ORAL_TABLET | Freq: Every evening | ORAL | Status: AC
Start: 2014-03-04 — End: ?

## 2014-03-04 MED ORDER — ENOXAPARIN SODIUM 30 MG/0.3ML ~~LOC~~ SOLN: 30.0000 mg | SUBCUTANEOUS | Status: AC

## 2014-03-04 MED ORDER — POLYETHYLENE GLYCOL 3350 17 G PO PACK: 17.0000 g | PACK | Freq: Every day | ORAL | Status: AC | PRN

## 2014-03-04 NOTE — Progress Notes (Signed)
Physical Therapy Treatment Patient Details Name: [name] MRN: [id number] DOB: 12/24/1914 Today's Date: 03/04/2014    History of Present Illness Pt is a 80 y.o. female adm due to fall at home. pt s/p IM nail Rt hip fx 03/02/14.    PT Comments    Pt very pleasant and motivated to participate in therapy. Pt able to stand but during pre gt activities demo bil LEs buckling. Mod (A) for SPT to chair. Pt progressing with HEP. Hopeful to D/C to SNF today.   Follow Up Recommendations  SNF     Equipment Recommendations  Other (comment)    Recommendations for Other Services       Precautions / Restrictions Precautions Precautions: Fall Restrictions Weight Bearing Restrictions: Yes RLE Weight Bearing: Weight bearing as tolerated    Mobility  Bed Mobility Overal bed mobility: Needs Assistance Bed Mobility: Supine to Sit     Supine to sit: HOB elevated;Mod assist     General bed mobility comments: pt demo difficulty moving Lt LE as well; (A) to bring LEs to/off EOB and to elevate trunk  Transfers Overall transfer level: Needs assistance Equipment used: Rolling walker (2 wheeled) Transfers: Sit to/from [facility] Sit to Stand: Min assist Stand pivot transfers: Mod assist       General transfer comment: pt able to power up with min (A) today to balance and elevate trunk; pt with heavy lean posteriorly; cues for hand placement and sequencing with RW; pivotal steps to chair only; bil LEs buckling when attempting to perform pre gt activities   Ambulation/Gait             General Gait Details: LEs buckling with pre gt    Stairs            Wheelchair Mobility    Modified Rankin (Stroke Patients Only)       Balance Overall balance assessment: Needs assistance;History of Falls Sitting-balance support: Feet supported;No upper extremity supported Sitting balance-Leahy Scale: Fair Sitting balance - Comments: denied any dizziness    Standing balance support: During functional activity;Bilateral upper extremity supported Standing balance-Leahy Scale: Poor Standing balance comment: RW to balance                     Cognition Arousal/Alertness: Awake/alert Behavior During Therapy: WFL for tasks assessed/performed Overall Cognitive Status: Within Functional Limits for tasks assessed                      Exercises General Exercises - Lower Extremity Ankle Circles/Pumps: AROM;Both;10 reps;Supine Quad Sets: AROM;Right;10 reps;Seated;Other (comment) (hold 5 sec count) Long Arc Quad: AROM;Both;5 reps;Seated;Other (comment) (difficulty mobilizing Lt LE ) Heel Slides: AAROM;Right;10 reps;Supine    General Comments        Pertinent Vitals/Pain Pain Assessment: Faces Faces Pain Scale: Hurts a little bit Pain Location: grimacing with movement of Rt hip Pain Descriptors / Indicators: Aching Pain Intervention(s): Monitored during session;Premedicated before session;Repositioned    Home Living                      Prior Function            PT Goals (current goals can now be found in the care plan section) Acute Rehab PT Goals Patient Stated Goal: to go to rehab today PT Goal Formulation: With patient Time For Goal Achievement: 03/10/14 Potential to Achieve Goals: Good Progress towards PT goals: Progressing toward goals    Frequency  Min  3X/week    PT Plan Current plan remains appropriate    Co-evaluation             End of Session Equipment Utilized During Treatment: Gait belt Activity Tolerance: Patient tolerated treatment well Patient left: in chair;with call bell/phone within reach     Time: 0823-0849 PT Time Calculation (min) (ACUTE ONLY): 26 min  Charges:  $Therapeutic Exercise: 8-22 mins $Therapeutic Activity: 8-22 mins                    G CodesGustavus Bryant, [place]  [phone] 03/04/2014, 10:02 AM

## 2014-03-04 NOTE — Progress Notes (Signed)
CARE MANAGEMENT NOTE 03/04/2014  Patient:  [name],[name]   Account Number:  [id number]  Date Initiated:  03/02/2014  Documentation initiated by:  Marylyn Ishihara  Subjective/Objective Assessment:   rt hip fx, s/p rt IM intertrochantric 12/21     Action/Plan:   PT/OT evals-recommended SNF   Anticipated DC Date:  03/04/2014   Anticipated DC Plan:  SKILLED NURSING FACILITY  In-house referral  Clinical Social Worker         Choice offered to / List presented to:             Status of service:  Completed, signed off Medicare Important Message given?  YES (If response is "NO", the following Medicare IM given date fields will be blank) Date Medicare IM given:  03/02/2014 Medicare IM given by:  Marylyn Ishihara Date Additional Medicare IM given:   Additional Medicare IM given by:    Discharge Disposition:  [place]  Per UR Regulation:  Reviewed for med. necessity/level of care/duration of stay

## 2014-03-04 NOTE — Progress Notes (Signed)
Patient ID: [name], female   DOB: [birth year], 80 y.o.   MRN: [id number]     Subjective:  Patient reports pain as mild to moderate.  Patient denies any CP or SOB sitting up in bed and in no acute distress  Objective:   VITALS:   Filed Vitals:   03/03/14 2104 03/04/14 0000 03/04/14 0400 03/04/14 0519  BP: 110/61   123/45  Pulse: 65   72  Temp: 98.2 F (36.8 C)   97.5 F (36.4 C)  TempSrc: Oral   Oral  Resp: 17 17 17 18   Height:      Weight:      SpO2: 97% 97% 97% 98%    ABD soft Sensation intact distally Dorsiflexion/Plantar flexion intact Incision: dressing C/D/I and no drainage Good foot and ankle motion but weak to resistance  Lab Results  Component Value Date   WBC 10.5 03/04/2014   HGB 11.4* 03/04/2014   HCT 34.8* 03/04/2014   MCV 90.2 03/04/2014   PLT 283 03/04/2014   BMET    Component Value Date/Time   NA 132* 03/04/2014 0426   K 4.4 03/04/2014 0426   CL 98 03/04/2014 0426   CO2 26 03/04/2014 0426   GLUCOSE 146* 03/04/2014 0426   BUN 17 03/04/2014 0426   CREATININE 0.89 03/04/2014 0426   CALCIUM 8.1* 03/04/2014 0426   GFRNONAA 53* 03/04/2014 0426   GFRAA 61* 03/04/2014 0426     Assessment/Plan: 2 Days Post-Op   Principal Problem:   Hip fracture Active Problems:   Hypothyroidism   Hypertension   Atrial fibrillation   Diastolic CHF   Advance diet Up with therapy Continue plan per medicine WBAT Dry dressing PRN Patient on contact precautions    DOUGLAS PARRY, [place] 03/04/2014, 8:04 AM  Discussed and agree with above.    Marchia Bond, MD Cell [phone]

## 2014-03-04 NOTE — Progress Notes (Signed)
Subjective:  Reports that she is doing well this morning, improved from yesterday in terms of pain. Had a bowel movement earlier. Denies CP/SOB.  Objective: Vital signs in last 24 hours: Filed Vitals:   03/03/14 2104 03/04/14 0000 03/04/14 0400 03/04/14 0519  BP: 110/61   123/45  Pulse: 65   72  Temp: 98.2 F (36.8 C)   97.5 F (36.4 C)  TempSrc: Oral   Oral  Resp: 17 17 17 18   Height:      Weight:      SpO2: 97% 97% 97% 98%   Weight change:   Intake/Output Summary (Last 24 hours) at 03/04/14 0933 Last data filed at 03/04/14 0523  Gross per 24 hour  Intake    350 ml  Output      0 ml  Net    350 ml   General Apperance: NAD HEENT: Normocephalic, atraumatic, PERRL, EOMI, anicteric sclera Neck: Supple, trachea midline Lungs: Clear to auscultation bilaterally. No wheezes, rhonchi or rales. Breathing comfortably on RA Chest Wall: Nontender, no deformity Heart: Regular rate and rhythm, no murmur/rub/gallop Abdomen: Soft, nontender, nondistended, no rebound/guarding Extremities: Warm and well perfused, no edema, tenderness to palpation anterior R hip and R shoulder. No obvious deformities, erythema, or joint swelling, operative site on left hip without hematoma, swelling, draining; dressing in place with no strikethrough Pulses: 2+ throughout Skin: laceration repair on R fourth finger c/d/i without surrounding erythema or drainage Neurologic: Alert and oriented x 3. CNII-XII intact. Normal strength and sensation, can move toes  Lab Results: Basic Metabolic Panel:  Recent Labs Lab 03/03/14 0550 03/04/14 0426  NA 134* 132*  K 3.7 4.4  CL 102 98  CO2 23 26  GLUCOSE 162* 146*  BUN 11 17  CREATININE 0.71 0.89  CALCIUM 8.0* 8.1*   CBC:  Recent Labs Lab 02/28/14 1220  03/03/14 0550 03/04/14 0426  WBC 10.0  < > 11.8* 10.5  NEUTROABS 8.6*  --   --   --   HGB 12.8  < > 11.4* 11.4*  HCT 39.0  < > 34.2* 34.8*  MCV 89.2  < > 91.0 90.2  PLT 317  < > 240 283  < > =  values in this interval not displayed. Coagulation:  Recent Labs Lab 03/01/14 0320 03-Mar-2014 0811 03/03/14 0550 03/04/14 0426  LABPROT 27.9* 14.1 15.3* 15.5*  INR 2.58* 1.08 1.20 1.22    Micro Results: Recent Results (from the past 240 hour(s))  Surgical PCR screen     Status: Abnormal   Collection Time: 02/28/14  8:22 PM  Result Value Ref Range Status   MRSA, PCR POSITIVE (A) NEGATIVE Final    Comment: RESULT CALLED TO, READ BACK BY AND VERIFIED WITH:  TO RLIGHT(RN0 BY TCLEVELAND 02/28/2014 AT 2357    Staphylococcus aureus POSITIVE (A) NEGATIVE Final    Comment:        The Xpert SA Assay (FDA approved for NASAL specimens in patients over 59 years of age), is one component of a comprehensive surveillance program.  Test performance has been validated by [employer] for patients greater than or equal to 85 year old. It is not intended to diagnose infection nor to guide or monitor treatment. RESULT CALLED TO, READ BACK BY AND VERIFIED WITH:  TO RLIGHT(RN0 BY TCLEVELAND 02/28/2014 AT 2357    Studies/Results: Dg Femur Right  2014/03/03   CLINICAL DATA:  Right hip fracture  EXAM: RIGHT FEMUR - 2 VIEW; DG C-ARM 61-120 MIN  COMPARISON:  02/28/2014  FLUOROSCOPY TIME:  50 seconds  FINDINGS: Five spot films were obtained intraoperatively and reveal placement of a medullary rod with proximal fixation screw traversing the femoral neck. Fracture fragments are in anatomic alignment.  IMPRESSION: ORIF of proximal right femoral fracture   Electronically Signed   By: Inez Catalina M.D.   On: 03/02/2014 15:35   Pelvis Portable  03/02/2014   CLINICAL DATA:  Status post right femoral fixation  EXAM: PORTABLE PELVIS 1-2 VIEWS  COMPARISON:  02/28/2014  FINDINGS: A medullary rod is noted in the right femur with proximal fixation screw. The fracture fragment this are in anatomic alignment. No other focal abnormality is seen.  IMPRESSION: Status post ORIF of proximal right femoral fracture    Electronically Signed   By: Inez Catalina M.D.   On: 03/02/2014 16:01   Dg C-arm 1-60 Min  03/02/2014   CLINICAL DATA:  Right hip fracture  EXAM: RIGHT FEMUR - 2 VIEW; DG C-ARM 61-120 MIN  COMPARISON:  02/28/2014  FLUOROSCOPY TIME:  50 seconds  FINDINGS: Five spot films were obtained intraoperatively and reveal placement of a medullary rod with proximal fixation screw traversing the femoral neck. Fracture fragments are in anatomic alignment.  IMPRESSION: ORIF of proximal right femoral fracture   Electronically Signed   By: Inez Catalina M.D.   On: 03/02/2014 15:35   Medications: I have reviewed the patient's current medications. Scheduled Meds: . Chlorhexidine Gluconate Cloth  6 each Topical Q0600  . digoxin  125 mcg Oral Daily  . diltiazem  300 mg Oral Daily  . docusate sodium  100 mg Oral BID  . dorzolamide-timolol  1 drop Both Eyes BID  . enoxaparin (LOVENOX) injection  30 mg Subcutaneous Q24H  . latanoprost  1 drop Both Eyes QHS  . levothyroxine  100 mcg Oral QAC breakfast  . metoprolol succinate  50 mg Oral BID  . mupirocin ointment   Nasal BID  . senna  1 tablet Oral BID  . Warfarin - Pharmacist Dosing Inpatient   Does not apply q1800   Continuous Infusions:   PRN Meds:.acetaminophen **OR** acetaminophen, alum & mag hydroxide-simeth, bisacodyl, HYDROcodone-acetaminophen, menthol-cetylpyridinium **OR** phenol, metoCLOPramide **OR** metoCLOPramide (REGLAN) injection, morphine injection, nitroGLYCERIN, ondansetron **OR** ondansetron (ZOFRAN) IV, polyethylene glycol Assessment/Plan: Principal Problem:   Hip fracture Active Problems:   Hypothyroidism   Hypertension   Atrial fibrillation   Diastolic CHF  R intertrochanteric hip fracture: 2/2 mechanical fall. Nondisplaced spiral fracture extending from right greater trochanter to medial proximal femoral diaphysis seen on XR R femur. No dislocation. S/p intertrochanteric intramedullary nail of R hip. Pain well controlled. PT recommending  SNF. -Advance diet as tolerated -Lovenox bridge to coumadin -Norco 5/325mg  1-2 tab Q6hr prn pain -Senna 8.6mg  BID, Colace 100mg  BID -Weight bearing as tolerated per ortho  Atrial Fibrillation: on Coumadin. CHADS2 score 3. On Cardizem CD 300mg  daily, Toprol XL 50mg  BID at home -Lovenox bridge to Coumadin -Continue home Cardizem CD 300mg  daily -Continue Toprol XL 50mg  BID  Ischemic cardiomyopathy, diastolic CHF: echocardiogram in 2009 showing an ejection fraction of 60-65% with mild aortic stenosis and moderate pulmonary hypertension and moderate mitral regurgitation. on digoxin 0.125mg  daily, lasix 40mg , imdur 60mg  daily, Toprol XL 50mg  BID at home -Hold lasix 40mg  -Continue digoxin 0.125mg  daily -Hold Imdur 60mg  daily -Continue Toprol XL 50mg  BID -Continue home nitrostat 0.4mg  Q44min prn chest pain  HTN: BP 119/50. On lasix 40mg , imdur 60mg  daily, Toprol XL 50mg  BID at home -Medications as above  Hypothyroidism: -continue  home synthroid 161mcg daily  FEN -regular diet  VTE ppx: SCDs, lovenox bridge to coumadin  Dispo: Likely d/c to SNF when bed available.  The patient does have a current PCP [facility] Pulling Arna Medici, MD) and does not need an [facility] hospital follow-up appointment after discharge.  The patient does not have transportation limitations that hinder transportation to clinic appointments.  .Services Needed at time of discharge: Y = Yes, Blank = No PT:   OT:   RN:   Equipment:   Other:     LOS: 4 days   Jacques Earthly, MD 03/04/2014, 9:33 AM

## 2014-03-04 NOTE — Progress Notes (Addendum)
Pt seen and examined with Dr. Randell Patient. Case d/w residents and plan was formulated on rounds. I agree with findings and plan as documented in resident note.  Assessment and Plan: 80 y/o female with R intertrochanteric hip fracture s/p mechanical fall  R intertrochanteric hip fx: - Pt is now s/p IM nail fixation in OR - c/w pain control - c/w PT - Hg stable now. No further w/u at this time  Afib: - INR now subtherapeutic s/p vitamin K for surgery - C/wtherapeutic lovenox till INR therapeutic - c/w toprol and cardizem  Diastolic CHF: - c/w digoxin and toprol - Would resume lasix and imdur on discharge  D/C planning to SNF when bed available

## 2014-03-04 NOTE — Discharge Instructions (Signed)
Fall Prevention and Home Safety °Falls cause injuries and can affect all age groups. It is possible to prevent falls.  °HOW TO PREVENT FALLS °· Wear shoes with rubber soles that do not have an opening for your toes. °· Keep the inside and outside of your house well lit. °· Use night lights throughout your home. °· Remove clutter from floors. °· Clean up floor spills. °· Remove throw rugs or fasten them to the floor with carpet tape. °· Do not place electrical cords across pathways. °· Put grab bars by your tub, shower, and toilet. Do not use towel bars as grab bars. °· Put handrails on both sides of the stairway. Fix loose handrails. °· Do not climb on stools or stepladders, if possible. °· Do not wax your floors. °· Repair uneven or unsafe sidewalks, walkways, or stairs. °· Keep items you use a lot within reach. °· Be aware of pets. °· Keep emergency numbers next to the telephone. °· Put smoke detectors in your home and near bedrooms. °Ask your doctor what other things you can do to prevent falls. °Document Released: 12/24/2008 Document Revised: 08/29/2011 Document Reviewed: 05/30/2011 °[employer]® Patient Information ©2015 [employer]. This information is not intended to replace advice given to you by your health care provider. Make sure you discuss any questions you have with your health care provider. ° °

## 2014-03-09 ENCOUNTER — Encounter: Payer: Self-pay | Admitting: Cardiology

## 2014-03-18 ENCOUNTER — Encounter: Payer: Self-pay | Admitting: Cardiology

## 2014-03-26 ENCOUNTER — Encounter (HOSPITAL_COMMUNITY): Payer: Self-pay | Admitting: Orthopedic Surgery

## 2014-05-12 DEATH — deceased

## 2014-09-07 ENCOUNTER — Other Ambulatory Visit: Payer: Self-pay

## 2015-03-20 IMAGING — CT CT ABD-PELV W/ CM
2 of 5 series · 16 of 46 positions shown, 18 images · IV contrast (READICAT/WATER & [ID] OMNI 300)
Comparison: None.

CLINICAL DATA: In a.  Weight loss.

CT ABDOMEN AND PELVIS WITH CONTRAST
TECHNIQUE: Multidetector CT imaging of the abdomen and pelvis was
performed following the standard protocol during bolus
administration of intravenous contrast.
Contrast: 100mL OMNIPAQUE IOHEXOL 300 MG/ML  SOLN
BUN and creatinine were obtained on site at [HOSPITAL] at
[HOSPITAL]..
Results:  BUN 12 mg/dL,  Creatinine 1.0 mg/dL.

[Series 2: abd/pelvis with · axial · 0.62mm/px · z∈[-231,+114]mm · 13 of 79 slices shown, 15 images]
[im 5/79  soft-tissue]
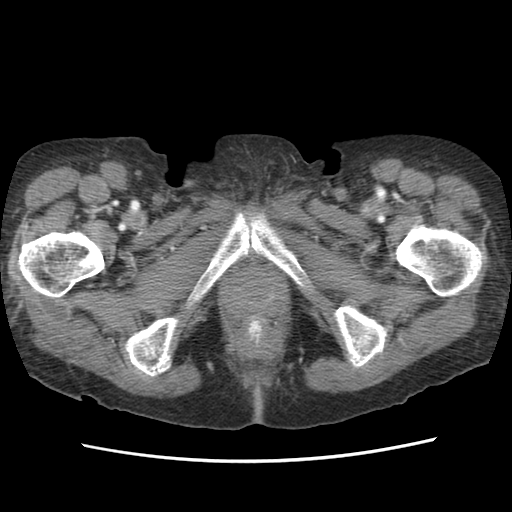
[im 5/79  bone]
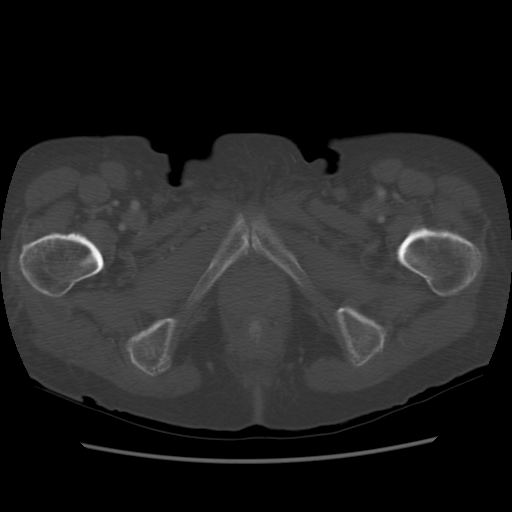
[im 9/79  soft-tissue]
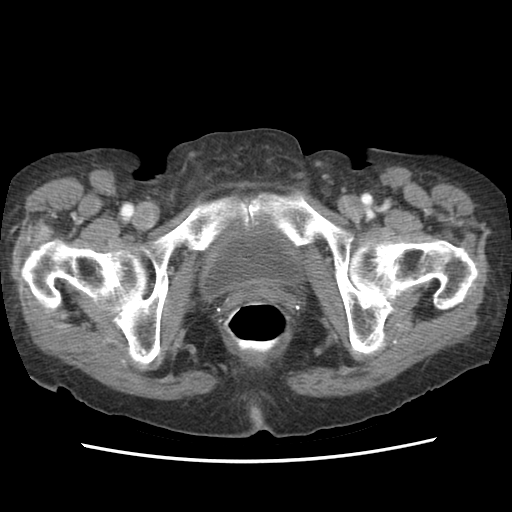
[im 18/79  soft-tissue]
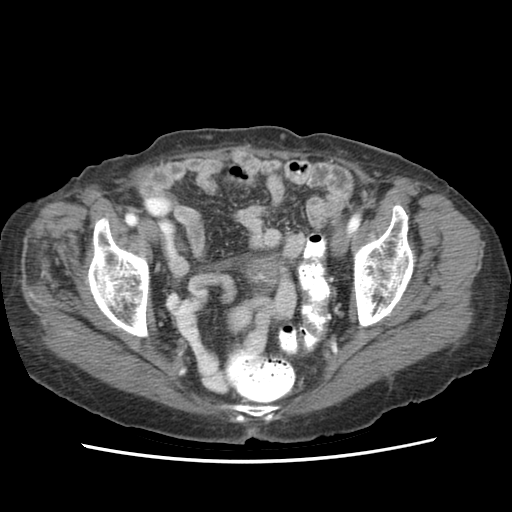
[im 22/79  soft-tissue]
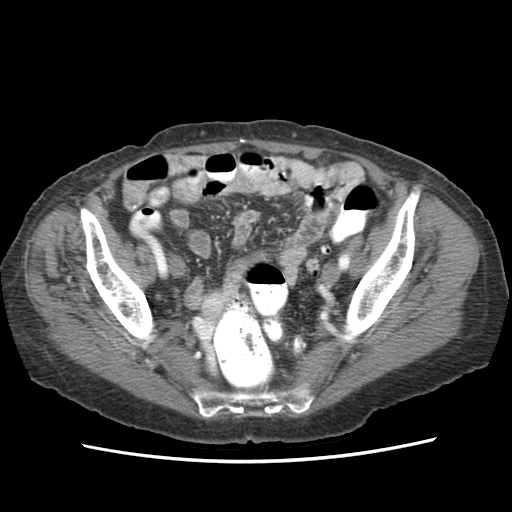
[im 27/79  soft-tissue]
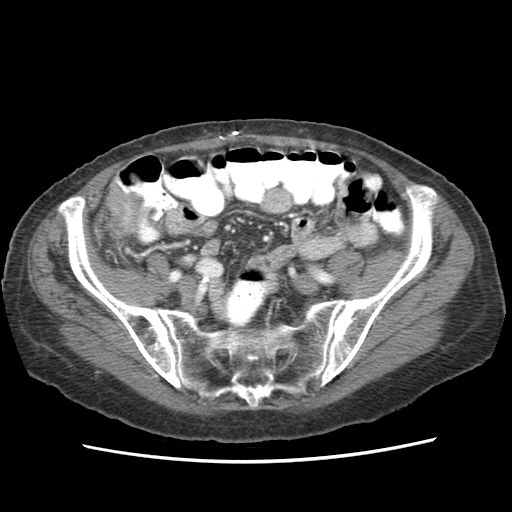
[im 35/79  soft-tissue]
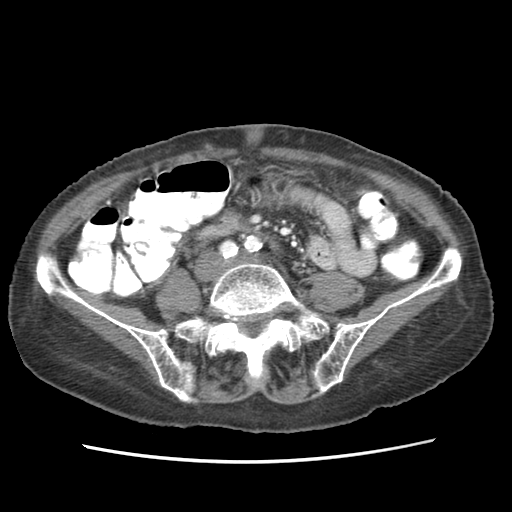
[im 40/79  soft-tissue]
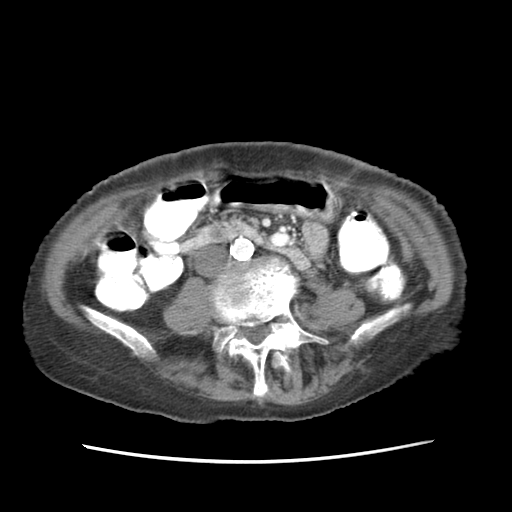
[im 44/79  soft-tissue]
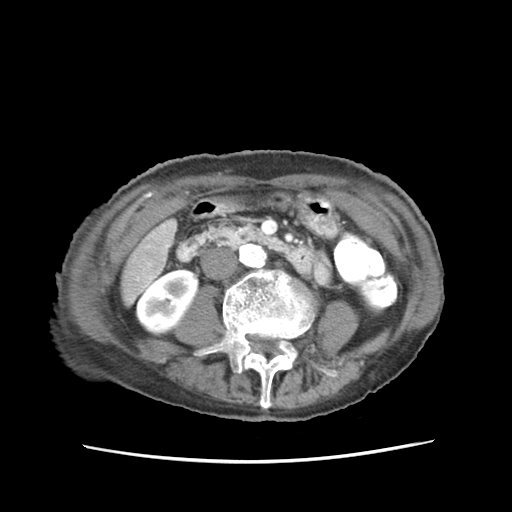
[im 53/79  soft-tissue]
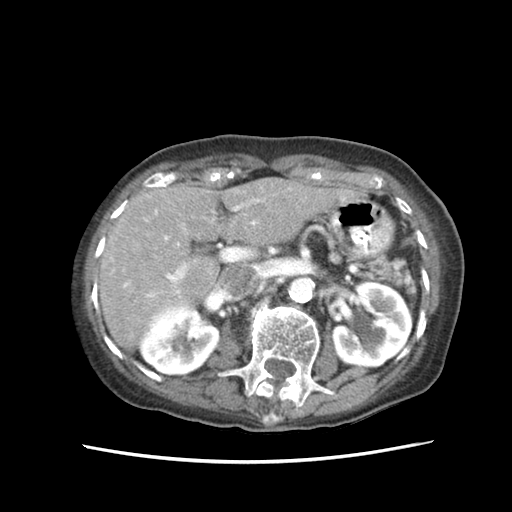
[im 53/79  bone]
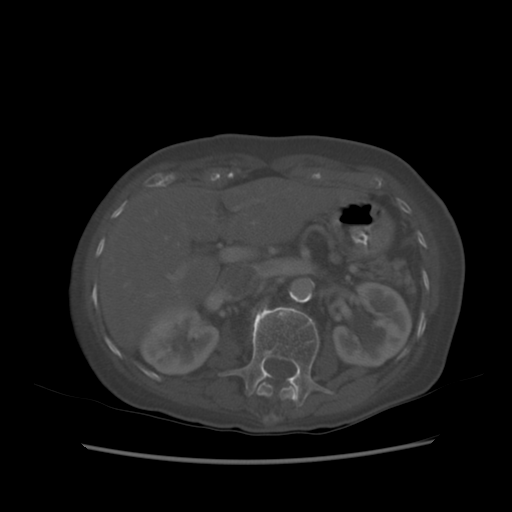
[im 57/79  soft-tissue]
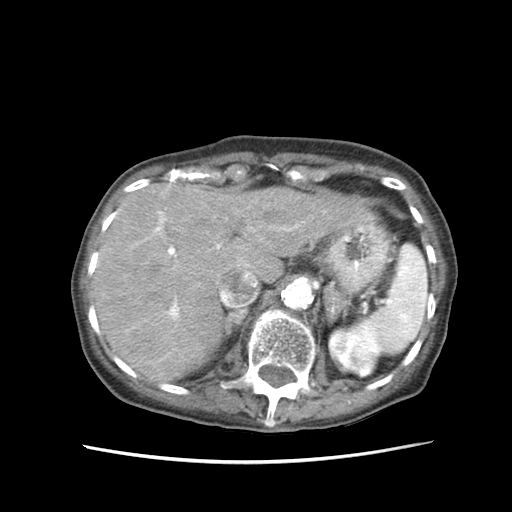
[im 61/79  soft-tissue]
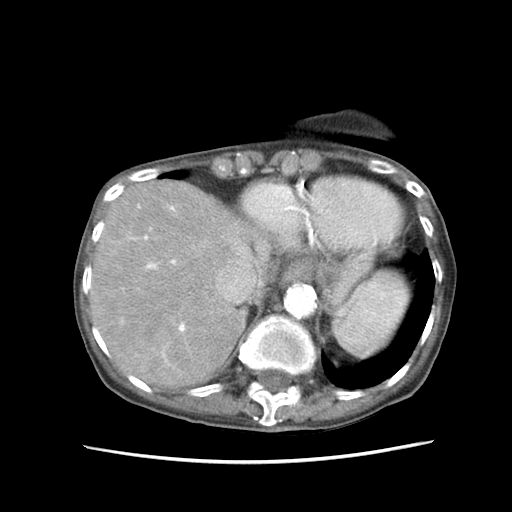
[im 70/79  soft-tissue]
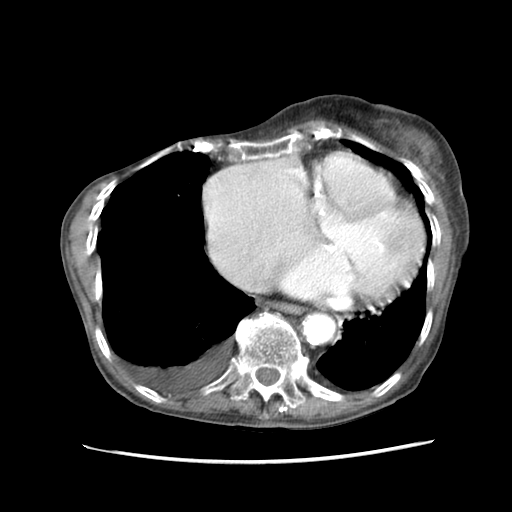
[im 74/79  soft-tissue]
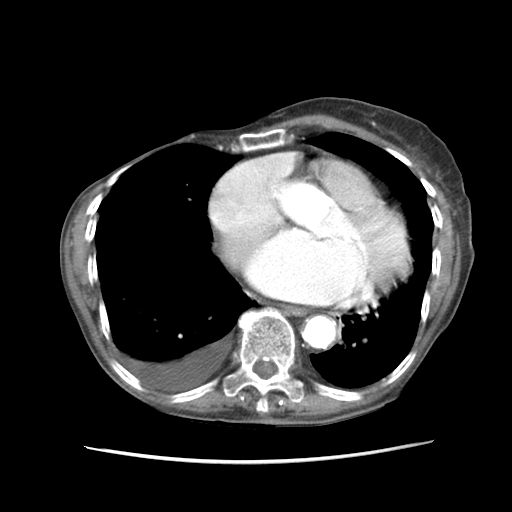

[Series 400: cor · coronal · 0.79mm/px · 3 of 103 slices shown]
[im 35/103  soft-tissue]
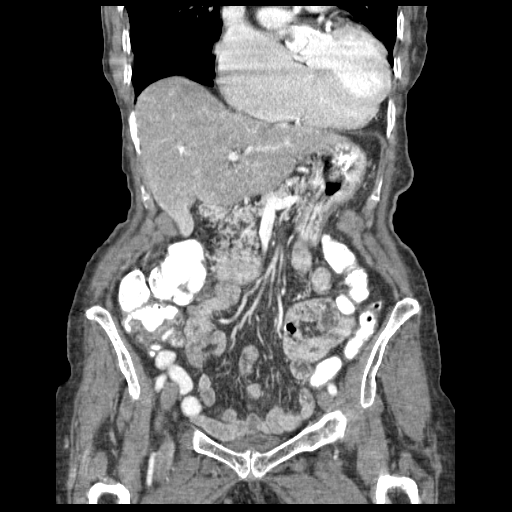
[im 46/103  soft-tissue]
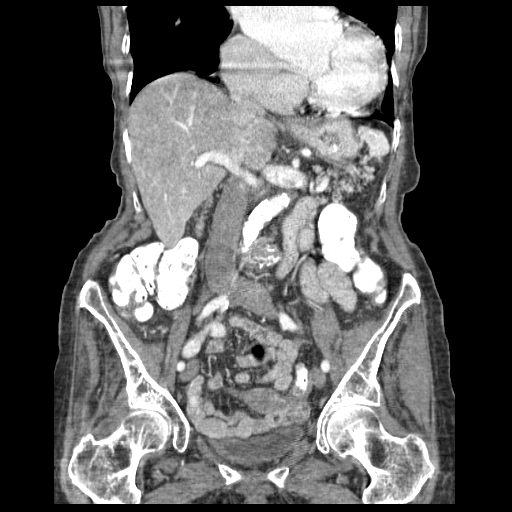
[im 57/103  soft-tissue]
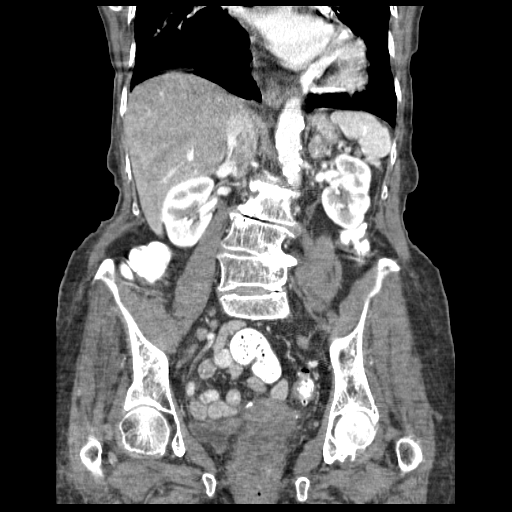

[16 of 46 positions shown; findings below may reference images not displayed]

FINDINGS: Small right pleural effusion.  The heart is mild to
moderately enlarged.  There is mild reticular lung base opacity
most consistent with subsegmental atelectasis.  No convincing
edema.  No lung base masses.

The liver, spleen and pancreas are unremarkable.  No gallbladder is
seen, it is presumed to be surgically absent.  No bile duct
dilation.  There is thickening of the left adrenal gland to 14 mm
without a discrete mass.  This is likely hyperplasia.

Small left renal cysts.  There are tiny additional low density
renal lesions are likely other cysts.  Mild renal cortical thinning
bilaterally.  No other renal abnormalities.  No hydronephrosis.
Normal ureters and bladder.

The uterus and adnexa are unremarkable.  No pathologically enlarged
lymph nodes.  No abnormal fluid collections.

Limited visualization of the lower chest to suggest a previous
right mastectomy.

Several sigmoid colon diverticula, but no evidence of
diverticulitis.  The bowel is otherwise unremarkable.  A normal
appendix is not discretely seen, but there are no findings of
appendicitis.

The bones are demineralized.  There are significant degenerative
changes of the visualized spine.  No osteoblastic or osteolytic
lesions.
IMPRESSION: No acute findings.

No evidence of malignancy within the abdomen or pelvis.

Cardiomegaly and small right pleural effusion, but no evidence of
pulmonary edema or lung base infiltrate.

Status post hysterectomy and cholecystectomy.

Sigmoid colon diverticula.  No evidence of diverticulitis.

Left adrenal gland thickening, likely hyperplasia.

Atherosclerotic change throughout a normal caliber abdominal aorta
and iliac arteries.  Significant degenerative changes of the
visualized spine.
